# Patient Record
Sex: Male | Born: 1955 | Race: Black or African American | Hispanic: No | Marital: Married | Smoking: Never smoker
Health system: Southern US, Community
[De-identification: ages and names within clinical notes are randomized; demographics above are authoritative.]

## PROBLEM LIST (undated history)

## (undated) DIAGNOSIS — J449 Chronic obstructive pulmonary disease, unspecified: Secondary | ICD-10-CM

---

## 1997-08-27 ENCOUNTER — Emergency Department (HOSPITAL_COMMUNITY): Admission: EM | Admit: 1997-08-27 | Discharge: 1997-08-27 | Payer: Self-pay | Admitting: Emergency Medicine

## 1997-11-20 ENCOUNTER — Emergency Department (HOSPITAL_COMMUNITY): Admission: EM | Admit: 1997-11-20 | Discharge: 1997-11-20 | Payer: Self-pay | Admitting: Internal Medicine

## 1997-11-20 ENCOUNTER — Encounter: Payer: Self-pay | Admitting: Internal Medicine

## 1999-10-21 ENCOUNTER — Emergency Department (HOSPITAL_COMMUNITY): Admission: EM | Admit: 1999-10-21 | Discharge: 1999-10-21 | Payer: Self-pay

## 2007-10-21 ENCOUNTER — Observation Stay (HOSPITAL_COMMUNITY): Admission: AC | Admit: 2007-10-21 | Discharge: 2007-10-21 | Payer: Self-pay
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
## 2010-10-22 LAB — POCT I-STAT, CHEM 8
BUN: 7
Calcium, Ion: 1.14
Chloride: 107
Glucose, Bld: 111 — ABNORMAL HIGH

## 2010-10-22 LAB — TYPE AND SCREEN
ABO/RH(D): A POS
Antibody Screen: NEGATIVE

## 2010-10-22 LAB — CBC
HCT: 42.7
Hemoglobin: 14.5
MCHC: 34
MCV: 97.9
RDW: 13.2

## 2014-11-13 ENCOUNTER — Emergency Department (HOSPITAL_COMMUNITY): Payer: No Typology Code available for payment source

## 2014-11-13 ENCOUNTER — Encounter (HOSPITAL_COMMUNITY): Payer: Self-pay | Admitting: Cardiology

## 2014-11-13 ENCOUNTER — Emergency Department (HOSPITAL_COMMUNITY)
Admission: EM | Admit: 2014-11-13 | Discharge: 2014-11-13 | Disposition: A | Discharge status: Home / Self Care | Payer: No Typology Code available for payment source | Attending: Emergency Medicine | Admitting: Emergency Medicine

## 2014-11-13 DIAGNOSIS — Y998 Other external cause status: Secondary | ICD-10-CM | POA: Insufficient documentation

## 2014-11-13 DIAGNOSIS — Y9389 Activity, other specified: Secondary | ICD-10-CM | POA: Diagnosis not present

## 2014-11-13 DIAGNOSIS — Z720 Tobacco use: Secondary | ICD-10-CM | POA: Insufficient documentation

## 2014-11-13 DIAGNOSIS — S59902A Unspecified injury of left elbow, initial encounter: Secondary | ICD-10-CM | POA: Diagnosis present

## 2014-11-13 DIAGNOSIS — S41102A Unspecified open wound of left upper arm, initial encounter: Secondary | ICD-10-CM

## 2014-11-13 DIAGNOSIS — S51802A Unspecified open wound of left forearm, initial encounter: Secondary | ICD-10-CM | POA: Diagnosis not present

## 2014-11-13 DIAGNOSIS — Y92410 Unspecified street and highway as the place of occurrence of the external cause: Secondary | ICD-10-CM | POA: Insufficient documentation

## 2014-11-13 MED ORDER — CEPHALEXIN 500 MG PO CAPS: 500.0000 mg | ORAL_CAPSULE | Freq: Four times a day (QID) | ORAL | Status: DC

## 2014-11-13 MED ORDER — TRAMADOL HCL 50 MG PO TABS: 50.0000 mg | ORAL_TABLET | Freq: Four times a day (QID) | ORAL | Status: DC | PRN

## 2014-11-13 NOTE — Discharge Instructions (Signed)
Take keflex as directed until gone. Take tramadol as needed for pain. Refer to attached documents for more information. Return to the ED with worsening or concerning symptoms.
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
## 2014-11-13 NOTE — ED Notes (Signed)
Declined W/C at D/C and was escorted to lobby by RN. 
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
## 2016-06-19 ENCOUNTER — Emergency Department (HOSPITAL_COMMUNITY)
Admission: EM | Admit: 2016-06-19 | Discharge: 2016-06-20 | Discharge status: Against Medical Advice | Payer: Self-pay | Attending: Emergency Medicine | Admitting: Emergency Medicine

## 2016-06-19 DIAGNOSIS — S0101XA Laceration without foreign body of scalp, initial encounter: Secondary | ICD-10-CM | POA: Insufficient documentation

## 2016-06-19 DIAGNOSIS — Y929 Unspecified place or not applicable: Secondary | ICD-10-CM | POA: Insufficient documentation

## 2016-06-19 DIAGNOSIS — F17200 Nicotine dependence, unspecified, uncomplicated: Secondary | ICD-10-CM | POA: Insufficient documentation

## 2016-06-19 DIAGNOSIS — Y999 Unspecified external cause status: Secondary | ICD-10-CM | POA: Insufficient documentation

## 2016-06-19 DIAGNOSIS — S0990XA Unspecified injury of head, initial encounter: Secondary | ICD-10-CM

## 2016-06-19 DIAGNOSIS — Z79899 Other long term (current) drug therapy: Secondary | ICD-10-CM | POA: Insufficient documentation

## 2016-06-19 DIAGNOSIS — Y939 Activity, unspecified: Secondary | ICD-10-CM | POA: Insufficient documentation

## 2016-06-19 MED ORDER — LIDOCAINE-EPINEPHRINE (PF) 2 %-1:200000 IJ SOLN: 20.0000 mL | Freq: Once | INTRAMUSCULAR | Status: DC
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
## 2016-06-20 NOTE — ED Notes (Signed)
Patient came out of the room stating he needs to leave.  Attending ED Physician made aware.  Physician on the way to talk with the patient.  Patient not in any distress at this time.  Airway protected and walking with steady gait.

## 2016-06-20 NOTE — ED Notes (Signed)
Per ED physician patient is to be discharged AMA.  IV taken out and head would washed.  Patient refused to have wound closed with staples.  Wound wrapped with gauze and no active bleeding noted.  Patient ambulated with steady gait to the waiting room.  Patient refused to sign AMA form.

## 2016-06-20 NOTE — ED Notes (Signed)
Patient does not want to have the scans done and wants to sign out AMA.  MD made aware.

## 2017-05-23 ENCOUNTER — Emergency Department (HOSPITAL_COMMUNITY): Payer: Self-pay

## 2017-05-23 ENCOUNTER — Other Ambulatory Visit: Payer: Self-pay

## 2017-05-23 ENCOUNTER — Inpatient Hospital Stay (HOSPITAL_COMMUNITY)
Admission: EM | Admit: 2017-05-23 | Discharge: 2017-05-26 | DRG: 199 | Disposition: A | Discharge status: Home / Self Care | Payer: Self-pay | Attending: General Surgery | Admitting: General Surgery

## 2017-05-23 ENCOUNTER — Encounter (HOSPITAL_COMMUNITY): Payer: Self-pay | Admitting: Emergency Medicine

## 2017-05-23 DIAGNOSIS — S0181XA Laceration without foreign body of other part of head, initial encounter: Secondary | ICD-10-CM

## 2017-05-23 DIAGNOSIS — J939 Pneumothorax, unspecified: Secondary | ICD-10-CM | POA: Diagnosis present

## 2017-05-23 DIAGNOSIS — S27331A Laceration of lung, unilateral, initial encounter: Secondary | ICD-10-CM | POA: Diagnosis present

## 2017-05-23 DIAGNOSIS — Y92512 Supermarket, store or market as the place of occurrence of the external cause: Secondary | ICD-10-CM

## 2017-05-23 DIAGNOSIS — S21112A Laceration without foreign body of left front wall of thorax without penetration into thoracic cavity, initial encounter: Secondary | ICD-10-CM | POA: Diagnosis present

## 2017-05-23 DIAGNOSIS — Z938 Other artificial opening status: Secondary | ICD-10-CM

## 2017-05-23 DIAGNOSIS — Z4682 Encounter for fitting and adjustment of non-vascular catheter: Secondary | ICD-10-CM

## 2017-05-23 DIAGNOSIS — S272XXA Traumatic hemopneumothorax, initial encounter: Principal | ICD-10-CM | POA: Diagnosis present

## 2017-05-23 DIAGNOSIS — S01511A Laceration without foreign body of lip, initial encounter: Secondary | ICD-10-CM | POA: Diagnosis present

## 2017-05-23 DIAGNOSIS — Z87891 Personal history of nicotine dependence: Secondary | ICD-10-CM

## 2017-05-23 DIAGNOSIS — S01411A Laceration without foreign body of right cheek and temporomandibular area, initial encounter: Secondary | ICD-10-CM | POA: Diagnosis present

## 2017-05-23 LAB — TYPE AND SCREEN
ABO/RH(D): A POS
Antibody Screen: NEGATIVE
UNIT DIVISION: 0
Unit division: 0

## 2017-05-23 LAB — BPAM RBC
Blood Product Expiration Date: 201905252359
Blood Product Expiration Date: 201905292359
ISSUE DATE / TIME: 201905042001
ISSUE DATE / TIME: 201905042001
UNIT TYPE AND RH: 9500
Unit Type and Rh: 9500

## 2017-05-23 LAB — CBC
HEMATOCRIT: 37.8 % — AB (ref 39.0–52.0)
Hemoglobin: 12.5 g/dL — ABNORMAL LOW (ref 13.0–17.0)
MCH: 31.7 pg (ref 26.0–34.0)
MCHC: 33.1 g/dL (ref 30.0–36.0)
MCV: 95.9 fL (ref 78.0–100.0)
Platelets: 250 10*3/uL (ref 150–400)
RBC: 3.94 MIL/uL — ABNORMAL LOW (ref 4.22–5.81)
RDW: 13 % (ref 11.5–15.5)
WBC: 8.2 10*3/uL (ref 4.0–10.5)

## 2017-05-23 LAB — BPAM FFP
BLOOD PRODUCT EXPIRATION DATE: 201905252359
Blood Product Expiration Date: 201905122359
ISSUE DATE / TIME: 201905042002
ISSUE DATE / TIME: 201905042002
Unit Type and Rh: 600
Unit Type and Rh: 6200

## 2017-05-23 LAB — PREPARE FRESH FROZEN PLASMA
UNIT DIVISION: 0
Unit division: 0

## 2017-05-23 LAB — COMPREHENSIVE METABOLIC PANEL
ALT: 14 U/L — ABNORMAL LOW (ref 17–63)
AST: 20 U/L (ref 15–41)
Albumin: 3.9 g/dL (ref 3.5–5.0)
Alkaline Phosphatase: 50 U/L (ref 38–126)
Anion gap: 11 (ref 5–15)
BILIRUBIN TOTAL: 0.5 mg/dL (ref 0.3–1.2)
BUN: 7 mg/dL (ref 6–20)
CHLORIDE: 108 mmol/L (ref 101–111)
CO2: 23 mmol/L (ref 22–32)
Calcium: 8.6 mg/dL — ABNORMAL LOW (ref 8.9–10.3)
Creatinine, Ser: 0.82 mg/dL (ref 0.61–1.24)
GFR calc Af Amer: >60 mL/min (ref >60)
GFR calc non Af Amer: >60 mL/min (ref >60)
Glucose, Bld: 117 mg/dL — ABNORMAL HIGH (ref 65–99)
POTASSIUM: 3.9 mmol/L (ref 3.5–5.1)
Sodium: 142 mmol/L (ref 135–145)
TOTAL PROTEIN: 6.6 g/dL (ref 6.5–8.1)

## 2017-05-23 LAB — I-STAT CHEM 8, ED
BUN: 7 mg/dL (ref 6–20)
Calcium, Ion: 1.15 mmol/L (ref 1.15–1.40)
Chloride: 107 mmol/L (ref 101–111)
Creatinine, Ser: 0.9 mg/dL (ref 0.61–1.24)
Glucose, Bld: 113 mg/dL — ABNORMAL HIGH (ref 65–99)
HEMATOCRIT: 39 % (ref 39.0–52.0)
Hemoglobin: 13.3 g/dL (ref 13.0–17.0)
Potassium: 3.8 mmol/L (ref 3.5–5.1)
SODIUM: 142 mmol/L (ref 135–145)
TCO2: 23 mmol/L (ref 22–32)

## 2017-05-23 LAB — PROTIME-INR
INR: 1.01
Prothrombin Time: 13.2 seconds (ref 11.4–15.2)

## 2017-05-23 LAB — ETHANOL: Alcohol, Ethyl (B): 168 mg/dL — ABNORMAL HIGH (ref <10)

## 2017-05-23 LAB — I-STAT CG4 LACTIC ACID, ED: Lactic Acid, Venous: 1.6 mmol/L (ref 0.5–1.9)

## 2017-05-23 LAB — ABO/RH: ABO/RH(D): A POS

## 2017-05-23 MED ORDER — IOHEXOL 300 MG/ML  SOLN
100.0000 mL | Freq: Once | INTRAMUSCULAR | Status: AC | PRN
  Administered 2017-05-23: 100 mL via INTRAVENOUS

## 2017-05-23 MED ORDER — IBUPROFEN 600 MG PO TABS: 600.0000 mg | ORAL_TABLET | Freq: Four times a day (QID) | ORAL | Status: DC | PRN

## 2017-05-23 MED ORDER — OXYCODONE HCL 5 MG PO TABS
5.0000 mg | ORAL_TABLET | ORAL | Status: DC | PRN
  Administered 2017-05-23 – 2017-05-25 (×2): 5 mg via ORAL
  Filled 2017-05-23 (×2): qty 1

## 2017-05-23 MED ORDER — TETANUS-DIPHTH-ACELL PERTUSSIS 5-2.5-18.5 LF-MCG/0.5 IM SUSP
0.5000 mL | Freq: Once | INTRAMUSCULAR | Status: AC
  Administered 2017-05-23: 0.5 mL via INTRAMUSCULAR

## 2017-05-23 MED ORDER — POTASSIUM CHLORIDE IN NACL 20-0.9 MEQ/L-% IV SOLN
INTRAVENOUS | Status: DC
  Administered 2017-05-23: via INTRAVENOUS
  Filled 2017-05-23: qty 1000

## 2017-05-23 MED ORDER — SODIUM CHLORIDE 0.9 % IV SOLN
INTRAVENOUS | Status: AC | PRN
  Administered 2017-05-23: 1000 mL via INTRAVENOUS

## 2017-05-23 MED ORDER — DOCUSATE SODIUM 100 MG PO CAPS
100.0000 mg | ORAL_CAPSULE | Freq: Two times a day (BID) | ORAL | Status: DC
  Administered 2017-05-24 – 2017-05-25 (×4): 100 mg via ORAL
  Filled 2017-05-23 (×6): qty 1

## 2017-05-23 MED ORDER — ONDANSETRON 4 MG PO TBDP
4.0000 mg | ORAL_TABLET | Freq: Four times a day (QID) | ORAL | Status: DC | PRN
  Filled 2017-05-23: qty 1

## 2017-05-23 MED ORDER — ENOXAPARIN SODIUM 40 MG/0.4ML ~~LOC~~ SOLN
40.0000 mg | SUBCUTANEOUS | Status: DC
  Administered 2017-05-24 – 2017-05-25 (×2): 40 mg via SUBCUTANEOUS
  Filled 2017-05-23 (×3): qty 0.4

## 2017-05-23 MED ORDER — ACETAMINOPHEN 325 MG PO TABS
650.0000 mg | ORAL_TABLET | Freq: Four times a day (QID) | ORAL | Status: DC
  Administered 2017-05-24 – 2017-05-26 (×10): 650 mg via ORAL
  Filled 2017-05-23 (×10): qty 2

## 2017-05-23 MED ORDER — OXYCODONE HCL 5 MG PO TABS
10.0000 mg | ORAL_TABLET | ORAL | Status: DC | PRN
  Administered 2017-05-24 – 2017-05-26 (×3): 10 mg via ORAL
  Filled 2017-05-23 (×3): qty 2

## 2017-05-23 MED ORDER — FENTANYL CITRATE (PF) 100 MCG/2ML IJ SOLN
INTRAMUSCULAR | Status: AC
  Filled 2017-05-23: qty 2

## 2017-05-23 MED ORDER — PANTOPRAZOLE SODIUM 40 MG PO TBEC
40.0000 mg | DELAYED_RELEASE_TABLET | Freq: Every day | ORAL | Status: DC
  Administered 2017-05-24 – 2017-05-26 (×3): 40 mg via ORAL
  Filled 2017-05-23 (×3): qty 1

## 2017-05-23 MED ORDER — FENTANYL CITRATE (PF) 100 MCG/2ML IJ SOLN
INTRAMUSCULAR | Status: AC | PRN
  Administered 2017-05-23: 75 ug via INTRAVENOUS

## 2017-05-23 MED ORDER — ONDANSETRON HCL 4 MG/2ML IJ SOLN: 4.0000 mg | Freq: Four times a day (QID) | INTRAMUSCULAR | Status: DC | PRN

## 2017-05-23 MED ORDER — MORPHINE SULFATE (PF) 4 MG/ML IV SOLN
1.0000 mg | INTRAVENOUS | Status: DC | PRN
  Administered 2017-05-23 – 2017-05-24 (×4): 1 mg via INTRAVENOUS
  Filled 2017-05-23 (×4): qty 1

## 2017-05-23 NOTE — Progress Notes (Addendum)
Chest Tube Insertion Procedure Note  Indications:  Clinically significant Pneumothorax  Pre-operative Diagnosis: Pneumothorax - left   Post-operative Diagnosis: Pneumothorax left  Procedure Details  Informed consent was obtained for the procedure, including sedation.  Risks of lung perforation, hemorrhage, arrhythmia, and adverse drug reaction were discussed. Time out performed.   After sterile skin prep, using standard technique, a 14 French pigtail chest tube was placed in the left lateral 4th rib space using Seldinger technique. Local was used to numb skin, soft tissue and rib. Pt also recd 75 mcg of IV fentanyl.  Vitals monitored throughout.   Findings: Some air rush; no air leak  Estimated Blood Loss:  Minimal         Specimens:  None              Complications:  None; patient tolerated the procedure well.         Disposition: ed         Condition: stable  Attending Attestation: I performed the procedure.  Mary Sella. Andrey Campanile, MD, FACS General, Bariatric, & Minimally Invasive Surgery [facility], [place]
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
## 2017-05-23 NOTE — ED Notes (Signed)
To CT with RN, EMT and MD.

## 2017-05-23 NOTE — ED Notes (Signed)
Patient placed on 2L [place] at this time.  
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
## 2017-05-23 NOTE — ED Notes (Signed)
Patient verbally refusing placement of chest tube.  Dr Andrey Campanile at bedside explaining consequences of no chest tube.  Patient states that he will be okay and does not want the chest tube.  NRB taken off patient at this time, to see if patient maintains oxygen saturation.

## 2017-05-23 NOTE — ED Notes (Signed)
Returned from CT.  [employer] in with patient at this time.

## 2017-05-24 ENCOUNTER — Inpatient Hospital Stay (HOSPITAL_COMMUNITY): Payer: Self-pay

## 2017-05-24 DIAGNOSIS — S01511A Laceration without foreign body of lip, initial encounter: Secondary | ICD-10-CM

## 2017-05-24 DIAGNOSIS — S21112A Laceration without foreign body of left front wall of thorax without penetration into thoracic cavity, initial encounter: Secondary | ICD-10-CM

## 2017-05-24 LAB — URINALYSIS, ROUTINE W REFLEX MICROSCOPIC
Bilirubin Urine: NEGATIVE
GLUCOSE, UA: NEGATIVE mg/dL
Hgb urine dipstick: NEGATIVE
Ketones, ur: 5 mg/dL — AB
Nitrite: NEGATIVE
PH: 5 (ref 5.0–8.0)
PROTEIN: NEGATIVE mg/dL
Specific Gravity, Urine: 1.028 (ref 1.005–1.030)
WBC, UA: >50 WBC/hpf — ABNORMAL HIGH (ref 0–5)

## 2017-05-24 LAB — BASIC METABOLIC PANEL
ANION GAP: 9 (ref 5–15)
BUN: 6 mg/dL (ref 6–20)
CO2: 26 mmol/L (ref 22–32)
Calcium: 8.5 mg/dL — ABNORMAL LOW (ref 8.9–10.3)
Chloride: 107 mmol/L (ref 101–111)
Creatinine, Ser: 0.72 mg/dL (ref 0.61–1.24)
GFR calc Af Amer: >60 mL/min (ref >60)
GLUCOSE: 95 mg/dL (ref 65–99)
POTASSIUM: 4.4 mmol/L (ref 3.5–5.1)
Sodium: 142 mmol/L (ref 135–145)

## 2017-05-24 LAB — CDS SEROLOGY

## 2017-05-24 LAB — CBC
HEMATOCRIT: 35.9 % — AB (ref 39.0–52.0)
HEMOGLOBIN: 11.7 g/dL — AB (ref 13.0–17.0)
MCH: 31.4 pg (ref 26.0–34.0)
MCHC: 32.6 g/dL (ref 30.0–36.0)
MCV: 96.2 fL (ref 78.0–100.0)
Platelets: 251 10*3/uL (ref 150–400)
RBC: 3.73 MIL/uL — AB (ref 4.22–5.81)
RDW: 13.2 % (ref 11.5–15.5)
WBC: 14.9 10*3/uL — AB (ref 4.0–10.5)

## 2017-05-24 LAB — HIV ANTIBODY (ROUTINE TESTING W REFLEX): HIV Screen 4th Generation wRfx: NONREACTIVE

## 2017-05-24 MED ORDER — SODIUM CHLORIDE 0.9% FLUSH: 3.0000 mL | INTRAVENOUS | Status: DC | PRN

## 2017-05-24 MED ORDER — SODIUM CHLORIDE 0.9 % IV SOLN: 250.0000 mL | INTRAVENOUS | Status: DC | PRN

## 2017-05-24 MED ORDER — PHENOL 1.4 % MT LIQD: 1.0000 | OROMUCOSAL | Status: DC | PRN

## 2017-05-24 MED ORDER — DEXTROSE 5 % IV SOLN: 1000.0000 mg | Freq: Four times a day (QID) | INTRAVENOUS | Status: DC | PRN

## 2017-05-24 MED ORDER — MAGIC MOUTHWASH
15.0000 mL | Freq: Four times a day (QID) | ORAL | Status: DC | PRN
  Filled 2017-05-24: qty 15

## 2017-05-24 MED ORDER — HYDROCORTISONE 2.5 % RE CREA
1.0000 "application " | TOPICAL_CREAM | Freq: Four times a day (QID) | RECTAL | Status: DC | PRN
  Filled 2017-05-24: qty 28.35

## 2017-05-24 MED ORDER — HYDROCORTISONE 1 % EX CREA
1.0000 "application " | TOPICAL_CREAM | Freq: Three times a day (TID) | CUTANEOUS | Status: DC | PRN
  Filled 2017-05-24: qty 28

## 2017-05-24 MED ORDER — MORPHINE SULFATE (PF) 2 MG/ML IV SOLN: 2.0000 mg | INTRAVENOUS | Status: DC | PRN

## 2017-05-24 MED ORDER — ENSURE SURGERY PO LIQD
237.0000 mL | Freq: Two times a day (BID) | ORAL | Status: DC
  Administered 2017-05-24: 237 mL via ORAL
  Filled 2017-05-24 (×8): qty 237

## 2017-05-24 MED ORDER — METHOCARBAMOL 500 MG PO TABS: 1000.0000 mg | ORAL_TABLET | Freq: Four times a day (QID) | ORAL | Status: DC | PRN

## 2017-05-24 MED ORDER — METHOCARBAMOL 1000 MG/10ML IJ SOLN
1000.0000 mg | Freq: Four times a day (QID) | INTRAMUSCULAR | Status: DC | PRN
  Filled 2017-05-24: qty 10

## 2017-05-24 MED ORDER — SODIUM CHLORIDE 0.9% FLUSH
3.0000 mL | Freq: Two times a day (BID) | INTRAVENOUS | Status: DC
  Administered 2017-05-24 – 2017-05-25 (×4): 3 mL via INTRAVENOUS

## 2017-05-24 MED ORDER — ALUM & MAG HYDROXIDE-SIMETH 200-200-20 MG/5ML PO SUSP: 30.0000 mL | Freq: Four times a day (QID) | ORAL | Status: DC | PRN

## 2017-05-24 MED ORDER — MENTHOL 3 MG MT LOZG
1.0000 | LOZENGE | OROMUCOSAL | Status: DC | PRN
  Filled 2017-05-24: qty 9

## 2017-05-24 MED ORDER — LIP MEDEX EX OINT
1.0000 "application " | TOPICAL_OINTMENT | Freq: Two times a day (BID) | CUTANEOUS | Status: DC
  Filled 2017-05-24: qty 7

## 2017-05-24 MED ORDER — BLISTEX MEDICATED EX OINT
TOPICAL_OINTMENT | Freq: Two times a day (BID) | CUTANEOUS | Status: DC
  Administered 2017-05-24 – 2017-05-25 (×3): 1 via TOPICAL
  Filled 2017-05-24: qty 6.3

## 2017-05-24 MED ORDER — GUAIFENESIN-DM 100-10 MG/5ML PO SYRP: 10.0000 mL | ORAL_SOLUTION | ORAL | Status: DC | PRN
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
## 2017-05-24 NOTE — Progress Notes (Signed)
Patient arrived on 4E-13 from ED. Patient has left pigtail chest tube. No air leaks. VSS. NSR. Patient placed on telemetry. Patient oriented to room, call bell within reach. Will continue to monitor. Victorino December, RN

## 2017-05-24 NOTE — ED Notes (Signed)
Report given to [facility]
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
## 2017-05-25 ENCOUNTER — Inpatient Hospital Stay (HOSPITAL_COMMUNITY): Payer: Self-pay

## 2017-05-25 LAB — BLOOD PRODUCT ORDER (VERBAL) VERIFICATION

## 2017-05-25 NOTE — Progress Notes (Signed)
Patient refused his colace this evening. Says he doesn't need it anymore. LBM 05/23/17. Will continue to monitor. Victorino December, RN
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
## 2017-05-26 ENCOUNTER — Inpatient Hospital Stay (HOSPITAL_COMMUNITY): Payer: Self-pay

## 2017-05-26 MED ORDER — IBUPROFEN 600 MG PO TABS: 600.0000 mg | ORAL_TABLET | Freq: Four times a day (QID) | ORAL | 0 refills | Status: DC | PRN

## 2017-05-26 MED ORDER — ACETAMINOPHEN 325 MG PO TABS: 650.0000 mg | ORAL_TABLET | Freq: Four times a day (QID) | ORAL | Status: DC

## 2017-05-26 MED ORDER — OXYCODONE HCL 5 MG PO TABS: 5.0000 mg | ORAL_TABLET | Freq: Three times a day (TID) | ORAL | 0 refills | Status: DC | PRN
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
## 2017-05-26 NOTE — Progress Notes (Signed)
CT removed per order and per protocol. Pt tolerated fair. Pt educated on need for CXR around lunch time. Will continue to monitor.

## 2017-05-27 ENCOUNTER — Encounter (HOSPITAL_COMMUNITY): Payer: Self-pay | Admitting: Cardiology
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
## 2021-06-23 ENCOUNTER — Encounter (HOSPITAL_COMMUNITY): Payer: Self-pay | Admitting: Emergency Medicine

## 2021-06-23 ENCOUNTER — Emergency Department (HOSPITAL_COMMUNITY): Payer: Medicare (Managed Care)

## 2021-06-23 ENCOUNTER — Emergency Department (HOSPITAL_COMMUNITY)
Admission: EM | Admit: 2021-06-23 | Discharge: 2021-06-23 | Disposition: A | Discharge status: Home / Self Care | Payer: Medicare (Managed Care) | Attending: Emergency Medicine | Admitting: Emergency Medicine

## 2021-06-23 ENCOUNTER — Other Ambulatory Visit: Payer: Self-pay

## 2021-06-23 DIAGNOSIS — M25462 Effusion, left knee: Secondary | ICD-10-CM | POA: Insufficient documentation

## 2021-06-23 DIAGNOSIS — X501XXA Overexertion from prolonged static or awkward postures, initial encounter: Secondary | ICD-10-CM | POA: Diagnosis not present

## 2021-06-23 DIAGNOSIS — M25562 Pain in left knee: Secondary | ICD-10-CM

## 2021-06-23 MED ORDER — IBUPROFEN 600 MG PO TABS: 600.0000 mg | ORAL_TABLET | Freq: Four times a day (QID) | ORAL | 0 refills | Status: DC | PRN

## 2021-06-23 MED ORDER — KETOROLAC TROMETHAMINE 15 MG/ML IJ SOLN
15.0000 mg | Freq: Once | INTRAMUSCULAR | Status: AC
  Administered 2021-06-23: 15 mg via INTRAMUSCULAR
  Filled 2021-06-23: qty 1

## 2021-06-23 NOTE — ED Notes (Signed)
Patient Alert and oriented to baseline. Stable and ambulatory to baseline. Patient verbalized understanding of the discharge instructions.  Patient belongings were taken by the patient.   

## 2021-06-23 NOTE — Progress Notes (Signed)
Orthopedic Tech Progress Note Patient Details:  DEAVEON LASSONDE [birth year] [id number]  Ortho Devices Type of Ortho Device: Crutches, Knee Immobilizer, Ace wrap Ortho Device/Splint Location: LLE Ortho Device/Splint Interventions: Ordered, Application, Adjustment   Post Interventions Patient Tolerated: Well Instructions Provided: Adjustment of device, Care of device, Poper ambulation with device Knee immobilizer and ace wrap applied, instructions on how to use crutches provided. I told the patient if he had anymore questions about the crutches that he could tell his nurse and I would come back to help him.  Vernona Rieger 06/23/2021, 10:59 AM
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
## 2021-06-23 NOTE — ED Triage Notes (Signed)
C/o L knee pain and swelling x 3 days.  States he twisted his knee.

## 2021-06-23 NOTE — ED Provider Notes (Signed)
[facility] EMERGENCY DEPARTMENT Provider Note   CSN: [id number] Arrival date & time: 06/23/21  7342     History  Chief Complaint  Patient presents with   Knee Pain    Clifford Maldonado is a 66 y.o. male.   Knee Pain Associated symptoms: no back pain and no fever    66 year old male presents emergency department with complaints of left knee pain.  Patient states that he was getting out of heavy machinery when he twisted his left knee and looking pain and causing secondary swelling.  Patient works for a [employer].  Patient has continued to work/walk on the knee since the incident causing increasing swelling and pain.  He has tried symptomatic therapy with ice and had minimal symptomatic relief.  Patient denies fever, redness of area, IV drug use, osteoarthritis hx, gout hx, fall, direct trauma to knee, rash.  Denies chills, sweats, chest pain, shortness of breath, abdominal pain, nausea/vomiting/diarrhea, urinary symptoms, change in bowel habits.  History reviewed. No pertinent past medical history.  History reviewed. No pertinent surgical history.  Home Medications Prior to Admission medications   Medication Sig Start Date End Date Taking? Authorizing Provider  ibuprofen (ADVIL) 600 MG tablet Take 1 tablet (600 mg total) by mouth every 6 (six) hours as needed. 06/23/21  Yes Sherian Maroon A, PA  acetaminophen (TYLENOL) 325 MG tablet Take 2 tablets (650 mg total) by mouth every 6 (six) hours. 05/26/17   Adam Phenix, PA-C  cephALEXin (KEFLEX) 500 MG capsule Take 1 capsule (500 mg total) by mouth 4 (four) times daily. 11/13/14   Emilia Beck, PA-C  oxyCODONE (OXY IR/ROXICODONE) 5 MG immediate release tablet Take 1 tablet (5 mg total) by mouth every 8 (eight) hours as needed for moderate pain. 05/26/17   Adam Phenix, PA-C  traMADol (ULTRAM) 50 MG tablet Take 1 tablet (50 mg total) by mouth every 6 (six) hours as needed. 11/13/14    Emilia Beck, PA-C      Allergies    Patient has no known allergies.    Review of Systems   Review of Systems  Constitutional:  Negative for chills and fever.  HENT:  Negative for ear pain and sore throat.   Eyes:  Negative for pain and visual disturbance.  Respiratory:  Negative for cough and shortness of breath.   Cardiovascular:  Negative for chest pain and palpitations.  Gastrointestinal:  Negative for abdominal pain and vomiting.  Genitourinary:  Negative for dysuria and hematuria.  Musculoskeletal:  Positive for arthralgias and joint swelling. Negative for back pain.  Skin:  Negative for color change, pallor, rash and wound.  Neurological:  Negative for seizures and syncope.  All other systems reviewed and are negative.  Physical Exam Updated Vital Signs BP (!) 173/92   Pulse 97   Temp 98.5 F (36.9 C) (Oral)   Resp 17   SpO2 97%  Physical Exam Vitals and nursing note reviewed.  Constitutional:      General: He is not in acute distress.    Appearance: Normal appearance. He is well-developed and normal weight. He is not ill-appearing, toxic-appearing or diaphoretic.  HENT:     Head: Normocephalic and atraumatic.  Eyes:     Conjunctiva/sclera: Conjunctivae normal.  Cardiovascular:     Rate and Rhythm: Normal rate and regular rhythm.     Heart sounds: No murmur heard. Pulmonary:     Effort: Pulmonary effort is normal. No respiratory distress.  Breath sounds: Normal breath sounds.  Abdominal:     General: Abdomen is flat.     Palpations: Abdomen is soft.     Tenderness: There is no abdominal tenderness. There is no guarding.  Musculoskeletal:        General: No swelling.     Cervical back: Neck supple.     Comments: Limited range of motion of left knee in flexion secondary to significant joint effusion.  Pain elicited with active range of motion.  No tenderness to palpation along medial and lateral joint lines, patella, tibia/fibula and distal femur.   Posterior tibial pulses full and intact bilaterally.  No overlying skin changes including ecchymosis, erythema, areas of induration.  Fluctuance noted along medial/lateral aspects of patella along joint line as well as superior lateral and superior medial.  No Baker's cyst noted.  No cellulitic changes to skin around the area.  Area not warmer to touch compared to surrounding skin.  Skin:    General: Skin is warm and dry.     Capillary Refill: Capillary refill takes less than 2 seconds.  Neurological:     General: No focal deficit present.     Mental Status: He is alert and oriented to person, place, and time.  Psychiatric:        Mood and Affect: Mood normal.    ED Results / Procedures / Treatments   Labs (all labs ordered are listed, but only abnormal results are displayed) Labs Reviewed - No data to display  EKG None  Radiology DG Knee Complete 4 Views Left  Result Date: 06/23/2021 CLINICAL DATA:  Left knee pain after twisting injury 3 days ago EXAM: LEFT KNEE - COMPLETE 4+ VIEW COMPARISON:  None Available. FINDINGS: No evidence of acute fracture. No malalignment. Moderate-sized knee joint effusion. No visible fat-fluid level. Joint spaces are preserved. No evidence of arthropathy or other focal bone abnormality. Soft tissues are unremarkable. IMPRESSION: 1. Moderate-sized knee joint effusion. 2. No acute fracture or malalignment. Electronically Signed   By: Duanne GuessNicholas  Plundo D.O.   On: 06/23/2021 09:39    Procedures Procedures    Medications Ordered in ED Medications  ketorolac (TORADOL) 15 MG/ML injection 15 mg (15 mg Intramuscular Given 06/23/21 1036)    ED Course/ Medical Decision Making/ A&P                           Medical Decision Making Amount and/or Complexity of Data Reviewed Radiology: ordered.   This patient presents to the ED for concern of left knee pain and effusion, this involves an extensive number of treatment options, and is a complaint that carries with it  a high risk of complications and morbidity.  The differential diagnosis includes osteoarthritis, septic arthritis, gout, Reiter's, ligamentous injury, fracture, meniscal tear   Co morbidities that complicate the patient evaluation  N/a   Lab Tests:  N/a   Imaging Studies ordered:  I ordered imaging studies including left knee x-ray I independently visualized and interpreted imaging which showed moderate joint effusion of the left knee with no acute bony abnormalities. I agree with the radiologist interpretation   Cardiac Monitoring: / EKG:  The patient was maintained on a cardiac monitor.  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  Patient recommended follow-up with PCP for further blood pressure management.. Otherwise within normal range and stable throughout visit. Laboratory/imaging studies significant for: Joint effusion but no fracture Given mechanism of injury and lack of concerning history, doubt gout, septic arthritis, OA.  Symptomatic therapy with rest, ice, elevation, compression with Ace bandage.  Crutches and knee immobilizer provided for patient to use as needed.  Recommend medical therapy with ibuprofen.  Close follow-up with orthopedics encouraged in 5 to 7 days for further treatment/evaluation of knee. Worrisome signs and symptoms  were discussed with the patient, and the patient acknowledged understanding to return to the ED if noticed. Patient was stable upon discharge.          Final Clinical Impression(s) / ED Diagnoses Final diagnoses:  Effusion of left knee  Acute pain of left knee    Rx / DC Orders ED Discharge Orders          Ordered    ibuprofen (ADVIL) 600 MG tablet  Every 6 hours PRN        06/23/21 1037              Peter Garter, [place] 06/23/21 1120    Wynetta Fines, MD 06/25/21 1055

## 2022-03-17 ENCOUNTER — Other Ambulatory Visit: Payer: Self-pay

## 2022-03-17 ENCOUNTER — Emergency Department (HOSPITAL_COMMUNITY): Payer: Medicare (Managed Care)

## 2022-03-17 ENCOUNTER — Emergency Department (HOSPITAL_COMMUNITY)
Admission: EM | Admit: 2022-03-17 | Discharge: 2022-03-17 | Disposition: A | Discharge status: Home / Self Care | Payer: Medicare (Managed Care) | Attending: Emergency Medicine | Admitting: Emergency Medicine

## 2022-03-17 ENCOUNTER — Encounter (HOSPITAL_COMMUNITY): Payer: Self-pay | Admitting: *Deleted

## 2022-03-17 DIAGNOSIS — R200 Anesthesia of skin: Secondary | ICD-10-CM | POA: Diagnosis not present

## 2022-03-17 DIAGNOSIS — J40 Bronchitis, not specified as acute or chronic: Secondary | ICD-10-CM | POA: Diagnosis not present

## 2022-03-17 DIAGNOSIS — R0602 Shortness of breath: Secondary | ICD-10-CM | POA: Diagnosis present

## 2022-03-17 DIAGNOSIS — R000 Tachycardia, unspecified: Secondary | ICD-10-CM | POA: Diagnosis not present

## 2022-03-17 HISTORY — DX: Chronic obstructive pulmonary disease, unspecified: J44.9

## 2022-03-17 LAB — CBC WITH DIFFERENTIAL/PLATELET
Abs Immature Granulocytes: 0.03 10*3/uL (ref 0.00–0.07)
Basophils Absolute: 0.1 10*3/uL (ref 0.0–0.1)
Basophils Relative: 1 %
Eosinophils Absolute: 0.2 10*3/uL (ref 0.0–0.5)
Eosinophils Relative: 2 %
HCT: 42.1 % (ref 39.0–52.0)
Hemoglobin: 14.5 g/dL (ref 13.0–17.0)
Immature Granulocytes: 0 %
Lymphocytes Relative: 8 %
Lymphs Abs: 0.7 10*3/uL (ref 0.7–4.0)
MCH: 33 pg (ref 26.0–34.0)
MCHC: 34.4 g/dL (ref 30.0–36.0)
MCV: 95.7 fL (ref 80.0–100.0)
Monocytes Absolute: 1.5 10*3/uL — ABNORMAL HIGH (ref 0.1–1.0)
Monocytes Relative: 16 %
Neutro Abs: 6.6 10*3/uL (ref 1.7–7.7)
Neutrophils Relative %: 73 %
Platelets: 253 10*3/uL (ref 150–400)
RBC: 4.4 MIL/uL (ref 4.22–5.81)
RDW: 13.5 % (ref 11.5–15.5)
WBC: 9 10*3/uL (ref 4.0–10.5)
nRBC: 0 % (ref 0.0–0.2)

## 2022-03-17 LAB — COMPREHENSIVE METABOLIC PANEL
ALT: 99 U/L — ABNORMAL HIGH (ref 0–44)
AST: 114 U/L — ABNORMAL HIGH (ref 15–41)
Albumin: 3.9 g/dL (ref 3.5–5.0)
Alkaline Phosphatase: 71 U/L (ref 38–126)
Anion gap: 14 (ref 5–15)
BUN: 5 mg/dL — ABNORMAL LOW (ref 8–23)
CO2: 25 mmol/L (ref 22–32)
Calcium: 8.8 mg/dL — ABNORMAL LOW (ref 8.9–10.3)
Chloride: 97 mmol/L — ABNORMAL LOW (ref 98–111)
Creatinine, Ser: 0.8 mg/dL (ref 0.61–1.24)
GFR, Estimated: >60 mL/min (ref >60)
Glucose, Bld: 99 mg/dL (ref 70–99)
Potassium: 4.4 mmol/L (ref 3.5–5.1)
Sodium: 136 mmol/L (ref 135–145)
Total Bilirubin: 0.4 mg/dL (ref 0.3–1.2)
Total Protein: 7.1 g/dL (ref 6.5–8.1)

## 2022-03-17 LAB — D-DIMER, QUANTITATIVE: D-Dimer, Quant: 0.56 ug/mL-FEU — ABNORMAL HIGH (ref 0.00–0.50)

## 2022-03-17 MED ORDER — DOXYCYCLINE HYCLATE 100 MG PO CAPS: 100.0000 mg | ORAL_CAPSULE | Freq: Two times a day (BID) | ORAL | 0 refills | Status: DC

## 2022-03-17 MED ORDER — DOXYCYCLINE HYCLATE 100 MG PO TABS
100.0000 mg | ORAL_TABLET | Freq: Once | ORAL | Status: AC
Start: 2022-03-17 — End: 2022-03-17
  Administered 2022-03-17: 100 mg via ORAL
  Filled 2022-03-17: qty 1

## 2022-03-17 MED ORDER — ALBUTEROL SULFATE HFA 108 (90 BASE) MCG/ACT IN AERS
4.0000 | INHALATION_SPRAY | Freq: Once | RESPIRATORY_TRACT | Status: AC
  Administered 2022-03-17: 4 via RESPIRATORY_TRACT
  Filled 2022-03-17: qty 6.7

## 2022-03-17 MED ORDER — METHYLPREDNISOLONE SODIUM SUCC 125 MG IJ SOLR
125.0000 mg | Freq: Once | INTRAMUSCULAR | Status: AC
  Administered 2022-03-17: 125 mg via INTRAVENOUS
  Filled 2022-03-17: qty 2

## 2022-03-17 MED ORDER — IPRATROPIUM-ALBUTEROL 0.5-2.5 (3) MG/3ML IN SOLN
3.0000 mL | Freq: Once | RESPIRATORY_TRACT | Status: AC
  Administered 2022-03-17: 3 mL via RESPIRATORY_TRACT
  Filled 2022-03-17: qty 3

## 2022-03-17 MED ORDER — LACTATED RINGERS IV BOLUS
1000.0000 mL | Freq: Once | INTRAVENOUS | Status: AC
  Administered 2022-03-17: 1000 mL via INTRAVENOUS

## 2022-03-17 MED ORDER — PREDNISONE 20 MG PO TABS: ORAL_TABLET | ORAL | 0 refills | Status: DC

## 2022-03-17 NOTE — ED Notes (Signed)
Discharge instructions discussed with pt. Verbalized understanding. VSS. No questions or concerns regarding discharges

## 2022-03-17 NOTE — ED Provider Notes (Signed)
[facility] Provider Note   CSN: [id number] Arrival date & time: 03/17/22  0308     History {Add pertinent medical, surgical, social history, OB history to HPI:1} Chief Complaint  Patient presents with   Shortness of Breath    Clifford Maldonado is a 67 y.o. male.  67 year old male with a history of chronic bronchitis who presents the ER today secondary to shortness of breath and productive cough.  Patient states going on for couple weeks.  Initially started upper respiratory infection but her breathing has gotten worse in the sputum and has not improved.  Worse with exertion.  No chest pain or back pain.  No fevers.  No lower extremity swelling.  Does have some mild numbness in his right thigh that is new over the last few months but is unclear on its etiology.  Has not seen anyone else for the symptoms yet.   Shortness of Breath      Home Medications Prior to Admission medications   Medication Sig Start Date End Date Taking? Authorizing Provider  acetaminophen (TYLENOL) 325 MG tablet Take 2 tablets (650 mg total) by mouth every 6 (six) hours. 05/26/17   Jill Alexanders, PA-C  cephALEXin (KEFLEX) 500 MG capsule Take 1 capsule (500 mg total) by mouth 4 (four) times daily. 11/13/14   Szekalski, [name], PA-C  ibuprofen (ADVIL) 600 MG tablet Take 1 tablet (600 mg total) by mouth every 6 (six) hours as needed. 06/23/21   Dion Saucier A, PA  oxyCODONE (OXY IR/ROXICODONE) 5 MG immediate release tablet Take 1 tablet (5 mg total) by mouth every 8 (eight) hours as needed for moderate pain. 05/26/17   Jill Alexanders, PA-C  traMADol (ULTRAM) 50 MG tablet Take 1 tablet (50 mg total) by mouth every 6 (six) hours as needed. 11/13/14   Alvina Chou, PA-C      Allergies    Patient has no known allergies.    Review of Systems   Review of Systems  Respiratory:  Positive for shortness of breath.     Physical Exam Updated Vital  Signs BP 136/71   Pulse (!) 108   Temp 98.6 F (37 C) (Oral)   Resp 20   Ht '5\' 4"'$  (1.626 m)   Wt 58.1 kg   SpO2 96%   BMI 21.97 kg/m  Physical Exam Vitals and nursing note reviewed.  Constitutional:      Appearance: He is well-developed.  HENT:     Head: Normocephalic and atraumatic.  Cardiovascular:     Rate and Rhythm: Tachycardia present.  Pulmonary:     Effort: Pulmonary effort is normal. No respiratory distress.     Breath sounds: Decreased breath sounds and wheezing present.  Abdominal:     General: There is no distension.  Musculoskeletal:        General: Normal range of motion.     Cervical back: Normal range of motion.  Neurological:     Mental Status: He is alert.     ED Results / Procedures / Treatments   Labs (all labs ordered are listed, but only abnormal results are displayed) Labs Reviewed  CBC WITH DIFFERENTIAL/PLATELET  COMPREHENSIVE METABOLIC PANEL  D-DIMER, QUANTITATIVE    EKG None  Radiology No results found.  Procedures Procedures  {Document cardiac monitor, telemetry assessment procedure when appropriate:1}  Medications Ordered in ED Medications  lactated ringers bolus 1,000 mL (has no administration in time range)  ipratropium-albuterol (DUONEB) 0.5-2.5 (3) MG/3ML  nebulizer solution 3 mL (has no administration in time range)  methylPREDNISolone sodium succinate (SOLU-MEDROL) 125 mg/2 mL injection 125 mg (has no administration in time range)  doxycycline (VIBRA-TABS) tablet 100 mg (has no administration in time range)    ED Course/ Medical Decision Making/ A&P   {   Click here for ABCD2, HEART and other calculatorsREFRESH Note before signing :1}                          Medical Decision Making Amount and/or Complexity of Data Reviewed Labs: ordered. Radiology: ordered. ECG/medicine tests: ordered.  Risk Prescription drug management.  Pneumonia versus bronchitis versus PE.  Will get x-ray give some breathing treatments and  some fluids and reassess for improvement and disposition. ***  {Document critical care time when appropriate:1} {Document review of labs and clinical decision tools ie heart score, Chads2Vasc2 etc:1}  {Document your independent review of radiology images, and any outside records:1} {Document your discussion with family members, caretakers, and with consultants:1} {Document social determinants of health affecting pt's care:1} {Document your decision making why or why not admission, treatments were needed:1} Final Clinical Impression(s) / ED Diagnoses Final diagnoses:  None    Rx / DC Orders ED Discharge Orders     None

## 2022-03-17 NOTE — ED Triage Notes (Signed)
Patient presents to ed via POV c/o sob onset 1 week ago states he is worse today c/o productive cough with green colored sputum.

## 2022-08-23 ENCOUNTER — Other Ambulatory Visit: Payer: Self-pay

## 2022-08-23 ENCOUNTER — Emergency Department (HOSPITAL_COMMUNITY): Payer: Medicare HMO

## 2022-08-23 ENCOUNTER — Inpatient Hospital Stay (HOSPITAL_COMMUNITY)
Admission: EM | Admit: 2022-08-23 | Discharge: 2022-08-25 | DRG: 189 | Disposition: A | Discharge status: Home / Self Care | Payer: Medicare HMO | Attending: Internal Medicine | Admitting: Internal Medicine

## 2022-08-23 ENCOUNTER — Encounter (HOSPITAL_COMMUNITY): Payer: Self-pay

## 2022-08-23 DIAGNOSIS — R0902 Hypoxemia: Secondary | ICD-10-CM | POA: Diagnosis present

## 2022-08-23 DIAGNOSIS — J9601 Acute respiratory failure with hypoxia: Secondary | ICD-10-CM | POA: Diagnosis present

## 2022-08-23 DIAGNOSIS — Z1152 Encounter for screening for COVID-19: Secondary | ICD-10-CM | POA: Diagnosis not present

## 2022-08-23 DIAGNOSIS — Z716 Tobacco abuse counseling: Secondary | ICD-10-CM | POA: Diagnosis not present

## 2022-08-23 DIAGNOSIS — J189 Pneumonia, unspecified organism: Secondary | ICD-10-CM | POA: Diagnosis present

## 2022-08-23 DIAGNOSIS — T380X5A Adverse effect of glucocorticoids and synthetic analogues, initial encounter: Secondary | ICD-10-CM | POA: Diagnosis not present

## 2022-08-23 DIAGNOSIS — D72829 Elevated white blood cell count, unspecified: Secondary | ICD-10-CM | POA: Diagnosis present

## 2022-08-23 DIAGNOSIS — Z79899 Other long term (current) drug therapy: Secondary | ICD-10-CM | POA: Diagnosis not present

## 2022-08-23 DIAGNOSIS — Z87891 Personal history of nicotine dependence: Secondary | ICD-10-CM | POA: Diagnosis not present

## 2022-08-23 DIAGNOSIS — Z7952 Long term (current) use of systemic steroids: Secondary | ICD-10-CM

## 2022-08-23 DIAGNOSIS — J440 Chronic obstructive pulmonary disease with acute lower respiratory infection: Secondary | ICD-10-CM | POA: Diagnosis present

## 2022-08-23 DIAGNOSIS — J441 Chronic obstructive pulmonary disease with (acute) exacerbation: Secondary | ICD-10-CM | POA: Diagnosis present

## 2022-08-23 LAB — CBC
HCT: 41.6 % (ref 39.0–52.0)
Hemoglobin: 14.3 g/dL (ref 13.0–17.0)
MCH: 32 pg (ref 26.0–34.0)
MCHC: 34.4 g/dL (ref 30.0–36.0)
MCV: 93.1 fL (ref 80.0–100.0)
Platelets: 439 10*3/uL — ABNORMAL HIGH (ref 150–400)
RBC: 4.47 MIL/uL (ref 4.22–5.81)
RDW: 14.6 % (ref 11.5–15.5)
WBC: 12.3 10*3/uL — ABNORMAL HIGH (ref 4.0–10.5)
nRBC: 0.2 % (ref 0.0–0.2)

## 2022-08-23 LAB — BASIC METABOLIC PANEL
Anion gap: 19 — ABNORMAL HIGH (ref 5–15)
BUN: 22 mg/dL (ref 8–23)
CO2: 28 mmol/L (ref 22–32)
Calcium: 9.7 mg/dL (ref 8.9–10.3)
Chloride: 86 mmol/L — ABNORMAL LOW (ref 98–111)
Creatinine, Ser: 0.97 mg/dL (ref 0.61–1.24)
GFR, Estimated: >60 mL/min (ref >60)
Glucose, Bld: 123 mg/dL — ABNORMAL HIGH (ref 70–99)
Potassium: 3.6 mmol/L (ref 3.5–5.1)
Sodium: 133 mmol/L — ABNORMAL LOW (ref 135–145)

## 2022-08-23 LAB — SARS CORONAVIRUS 2 BY RT PCR: SARS Coronavirus 2 by RT PCR: NEGATIVE

## 2022-08-23 LAB — HIV ANTIBODY (ROUTINE TESTING W REFLEX): HIV Screen 4th Generation wRfx: NONREACTIVE

## 2022-08-23 LAB — TROPONIN I (HIGH SENSITIVITY)
Troponin I (High Sensitivity): 25 ng/L — ABNORMAL HIGH (ref <18)
Troponin I (High Sensitivity): 24 ng/L — ABNORMAL HIGH (ref <18)

## 2022-08-23 MED ORDER — GUAIFENESIN ER 600 MG PO TB12
600.0000 mg | ORAL_TABLET | Freq: Two times a day (BID) | ORAL | Status: DC
  Administered 2022-08-23 – 2022-08-25 (×4): 600 mg via ORAL
  Filled 2022-08-23 (×4): qty 1

## 2022-08-23 MED ORDER — SENNOSIDES-DOCUSATE SODIUM 8.6-50 MG PO TABS: 1.0000 | ORAL_TABLET | Freq: Every evening | ORAL | Status: DC | PRN

## 2022-08-23 MED ORDER — METHYLPREDNISOLONE SODIUM SUCC 125 MG IJ SOLR
125.0000 mg | Freq: Once | INTRAMUSCULAR | Status: AC
  Administered 2022-08-23: 125 mg via INTRAVENOUS
  Filled 2022-08-23: qty 2

## 2022-08-23 MED ORDER — ACETAMINOPHEN 325 MG PO TABS: 650.0000 mg | ORAL_TABLET | Freq: Four times a day (QID) | ORAL | Status: DC | PRN

## 2022-08-23 MED ORDER — ONDANSETRON HCL 4 MG PO TABS: 4.0000 mg | ORAL_TABLET | Freq: Four times a day (QID) | ORAL | Status: DC | PRN

## 2022-08-23 MED ORDER — ARFORMOTEROL TARTRATE 15 MCG/2ML IN NEBU
15.0000 ug | INHALATION_SOLUTION | Freq: Two times a day (BID) | RESPIRATORY_TRACT | Status: DC
  Administered 2022-08-23 – 2022-08-25 (×4): 15 ug via RESPIRATORY_TRACT
  Filled 2022-08-23 (×4): qty 2

## 2022-08-23 MED ORDER — METHYLPREDNISOLONE SODIUM SUCC 125 MG IJ SOLR
80.0000 mg | INTRAMUSCULAR | Status: DC
  Administered 2022-08-24: 80 mg via INTRAVENOUS
  Filled 2022-08-23: qty 2

## 2022-08-23 MED ORDER — ACETAMINOPHEN 650 MG RE SUPP: 650.0000 mg | Freq: Four times a day (QID) | RECTAL | Status: DC | PRN

## 2022-08-23 MED ORDER — ENOXAPARIN SODIUM 40 MG/0.4ML IJ SOSY
40.0000 mg | PREFILLED_SYRINGE | INTRAMUSCULAR | Status: DC
  Filled 2022-08-23 (×2): qty 0.4

## 2022-08-23 MED ORDER — ONDANSETRON HCL 4 MG/2ML IJ SOLN: 4.0000 mg | Freq: Four times a day (QID) | INTRAMUSCULAR | Status: DC | PRN

## 2022-08-23 MED ORDER — IPRATROPIUM-ALBUTEROL 0.5-2.5 (3) MG/3ML IN SOLN: 3.0000 mL | Freq: Four times a day (QID) | RESPIRATORY_TRACT | Status: DC | PRN

## 2022-08-23 MED ORDER — SODIUM CHLORIDE 0.9 % IV SOLN
2.0000 g | INTRAVENOUS | Status: DC
  Administered 2022-08-23 – 2022-08-24 (×2): 2 g via INTRAVENOUS
  Filled 2022-08-23 (×2): qty 20

## 2022-08-23 MED ORDER — IPRATROPIUM-ALBUTEROL 0.5-2.5 (3) MG/3ML IN SOLN
6.0000 mL | Freq: Once | RESPIRATORY_TRACT | Status: AC
  Administered 2022-08-23: 6 mL via RESPIRATORY_TRACT
  Filled 2022-08-23: qty 3

## 2022-08-23 MED ORDER — BUDESONIDE 0.25 MG/2ML IN SUSP
0.2500 mg | Freq: Two times a day (BID) | RESPIRATORY_TRACT | Status: DC
  Administered 2022-08-23 – 2022-08-25 (×4): 0.25 mg via RESPIRATORY_TRACT
  Filled 2022-08-23 (×4): qty 2

## 2022-08-23 MED ORDER — SODIUM CHLORIDE 0.9 % IV SOLN
500.0000 mg | INTRAVENOUS | Status: DC
  Administered 2022-08-23 – 2022-08-24 (×2): 500 mg via INTRAVENOUS
  Filled 2022-08-23 (×2): qty 5

## 2022-08-23 NOTE — ED Notes (Signed)
Wife Jeanann Lewandowsky [phone] would like an update asap

## 2022-08-23 NOTE — ED Notes (Signed)
Pt was unable to tolerate weaning off 2l [place], SpO2 87% ra, Placed back on 3l [place]

## 2022-08-23 NOTE — Hospital Course (Signed)
Clifford Maldonado is a 67 y.o. male with medical history significant for COPD with chronic bronchitis who is admitted with acute hypoxic respiratory failure due to multifocal pneumonia and COPD exacerbation.

## 2022-08-23 NOTE — H&P (Signed)
History and Physical    Clifford Maldonado ZOX:[id number] DOB: [birth year] DOA: 08/23/2022  PCP: Pcp, No  Patient coming from: Home  I have personally briefly reviewed patient's old medical records in [facility]  Chief Complaint: Shortness of breath  HPI: Clifford Maldonado is a 67 y.o. male with medical history significant for COPD with chronic bronchitis who presented to the ED for evaluation of shortness of breath.  Patient states for the last 3 days he has been having cough productive of green sputum.  He says he was riding in a car with a coworker a couple days ago who was smoking marijuana during the entire car ride.  He says this aggravated his bronchitis and he has been having shortness of breath since.  He reports subjective fevers and has been taking ibuprofen as needed.  He denies any chest pain, nausea, vomiting, dysuria, diarrhea.  He denies any tobacco use or illicit drug use.  He reports drinking a beer once in a while but no recent binge drinking.  ED Course  Labs/Imaging on admission: I have personally reviewed following labs and imaging studies.  Initial vitals showed 135/65, pulse 118, RR 22, temp 97.8 F, SpO2 85% on room air.  Patient placed on 3 L O2 via [place] with SpO2 improved to 93-95%.  Labs show WBC 12.3, hemoglobin 14.3, platelets 439,000, sodium 133, potassium 3.6, bicarb 28, BUN 22, creatinine 0.97, serum glucose 123, troponin 25.  SARS-CoV-2 PCR in process.  Portable chest x-ray shows extensive new patchy infiltrates in both lower lung fields, more so on the left side.  Possible small left pleural effusion.  Patient was given IV ceftriaxone and azithromycin, DuoNeb, IV Solu-Medrol 125 mg.  Patient desaturated to 87% on room air when trialed off of supplemental O2.  The hospitalist service was consulted to admit for further evaluation and management.  Review of Systems: All systems reviewed and are negative except as documented in history of present illness  above.   Past Medical History:  Diagnosis Date   COPD (chronic obstructive pulmonary disease) (HCC)     History reviewed. No pertinent surgical history.  Social History:  reports that he has never smoked. He has never used smokeless tobacco. He reports current alcohol use. He reports that he does not use drugs.  No Known Allergies  History reviewed. No pertinent family history.   Prior to Admission medications   Medication Sig Start Date End Date Taking? Authorizing Provider  acetaminophen (TYLENOL) 325 MG tablet Take 2 tablets (650 mg total) by mouth every 6 (six) hours. 05/26/17   Adam Phenix, PA-C  cephALEXin (KEFLEX) 500 MG capsule Take 1 capsule (500 mg total) by mouth 4 (four) times daily. 11/13/14   Emilia Beck, PA-C  doxycycline (VIBRAMYCIN) 100 MG capsule Take 1 capsule (100 mg total) by mouth 2 (two) times daily. One po bid x 7 days 03/17/22   Mesner, Barbara Cower, MD  ibuprofen (ADVIL) 600 MG tablet Take 1 tablet (600 mg total) by mouth every 6 (six) hours as needed. 06/23/21   Sherian Maroon A, PA  oxyCODONE (OXY IR/ROXICODONE) 5 MG immediate release tablet Take 1 tablet (5 mg total) by mouth every 8 (eight) hours as needed for moderate pain. 05/26/17   Adam Phenix, PA-C  predniSONE (DELTASONE) 20 MG tablet 2 tabs po daily x 4 days 03/17/22   Mesner, Barbara Cower, MD  traMADol (ULTRAM) 50 MG tablet Take 1 tablet (50 mg total) by mouth every 6 (six) hours as  needed. 11/13/14   Emilia Beck, PA-C    Physical Exam: Vitals:   08/23/22 2149 08/23/22 2200 08/23/22 2215 08/23/22 2230  BP:  117/71 139/70 109/74  Pulse: (!) 106 (!) 103 89 84  Resp: 18 20 20    Temp:      TempSrc:      SpO2: 94% 99% 95% 96%  Weight:       Constitutional: Resting in bed, NAD, calm, comfortable Eyes: EOMI, lids and conjunctivae normal ENMT: Mucous membranes are moist. Posterior pharynx clear of any exudate or lesions.Normal dentition.  Neck: normal, supple, no masses. Respiratory:  Coarse expiratory wheezing lower lung fields. Normal respiratory effort. No accessory muscle use.  Cardiovascular: Tachycardic, no murmurs / rubs / gallops. No extremity edema. 2+ pedal pulses. Abdomen: no tenderness, no masses palpated.  Musculoskeletal: no clubbing / cyanosis. No joint deformity upper and lower extremities. Good ROM, no contractures. Normal muscle tone.  Skin: no rashes, lesions, ulcers. No induration Neurologic: Sensation intact. Strength 5/5 in all 4.  Psychiatric: Normal judgment and insight. Alert and oriented x 3. Normal mood.   EKG: Personally reviewed. Sinus tachycardia, rate 112, motion artifact.  Similar to prior.  Assessment/Plan Principal Problem:   Acute respiratory failure with hypoxia (HCC) Active Problems:   Multifocal pneumonia   COPD with acute exacerbation (HCC)   Clifford Maldonado is a 67 y.o. male with medical history significant for COPD with chronic bronchitis who is admitted with acute hypoxic respiratory failure due to multifocal pneumonia and COPD exacerbation.  Assessment and Plan: Acute respiratory failure with hypoxia due to multifocal pneumonia: SpO2 as low as 85% on RA.  Stable on 3 L via [place].  CXR concerning for multifocal pneumonia.  Exacerbation of COPD/bronchitis also contributing.  COVID is negative. -Continue IV ceftriaxone and azithromycin -Mucolytic's, wean supplemental O2 as able  Acute exacerbation of COPD with chronic bronchitis: Symptoms improving but wheezing still present at time of admission. -Brovana/Pulmicort BID, DuoNebs as needed -IV Solu-Medrol 40 mg twice daily -Azithromycin   DVT prophylaxis: enoxaparin (LOVENOX) injection 40 mg Start: 08/23/22 2315 Code Status: Full code, confirmed with patient on admission Family Communication: Discussed with patient, he will discuss with family Disposition Plan: From home and likely discharge to home pending clinical progress Consults called: None Severity of Illness: The  appropriate patient status for this patient is OBSERVATION. Observation status is judged to be reasonable and necessary in order to provide the required intensity of service to ensure the patient's safety. The patient's presenting symptoms, physical exam findings, and initial radiographic and laboratory data in the context of their medical condition is felt to place them at decreased risk for further clinical deterioration. Furthermore, it is anticipated that the patient will be medically stable for discharge from the hospital within 2 midnights of admission.   Darreld Mclean MD [facility]  If 7PM-7AM, please contact night-coverage [email or web address]  08/23/2022, 11:09 PM

## 2022-08-23 NOTE — ED Notes (Signed)
Attempted to take pt off the O2, was unable to tolerate coming off [place], pt dropped down to 09% SpO2, placed back on 2l SpO2 and will ateempted to wean him off in 1hr. Primary ER provider has been made aware
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
## 2022-08-23 NOTE — ED Triage Notes (Signed)
Pt arrives with c/o SOb that started about 3 days ago. Pt endorses productive cough, CP, and wheezing. Pt was 855 on RA on arrival. Pt placed on 2L [place] in triage. Pt has hx of COPD. Pt oxygen sat at 91% after 2L [place].

## 2022-08-24 DIAGNOSIS — J9601 Acute respiratory failure with hypoxia: Secondary | ICD-10-CM | POA: Diagnosis not present

## 2022-08-24 LAB — CBC
HCT: 39.2 % (ref 39.0–52.0)
Hemoglobin: 13.4 g/dL (ref 13.0–17.0)
MCH: 31.7 pg (ref 26.0–34.0)
MCHC: 34.2 g/dL (ref 30.0–36.0)
MCV: 92.7 fL (ref 80.0–100.0)
Platelets: 457 10*3/uL — ABNORMAL HIGH (ref 150–400)
RBC: 4.23 MIL/uL (ref 4.22–5.81)
RDW: 14.8 % (ref 11.5–15.5)
WBC: 9.8 10*3/uL (ref 4.0–10.5)
nRBC: 0.2 % (ref 0.0–0.2)

## 2022-08-24 LAB — PROCALCITONIN: Procalcitonin: 1.58 ng/mL

## 2022-08-24 LAB — BASIC METABOLIC PANEL WITH GFR
Anion gap: 12 (ref 5–15)
BUN: 23 mg/dL (ref 8–23)
CO2: 31 mmol/L (ref 22–32)
Calcium: 9.1 mg/dL (ref 8.9–10.3)
Chloride: 91 mmol/L — ABNORMAL LOW (ref 98–111)
Creatinine, Ser: 0.97 mg/dL (ref 0.61–1.24)
GFR, Estimated: >60 mL/min (ref >60)
Glucose, Bld: 181 mg/dL — ABNORMAL HIGH (ref 70–99)
Potassium: 3.8 mmol/L (ref 3.5–5.1)
Sodium: 134 mmol/L — ABNORMAL LOW (ref 135–145)

## 2022-08-24 LAB — EXPECTORATED SPUTUM ASSESSMENT W GRAM STAIN, RFLX TO RESP C

## 2022-08-24 MED ORDER — MONTELUKAST SODIUM 10 MG PO TABS
10.0000 mg | ORAL_TABLET | Freq: Every day | ORAL | Status: DC
  Administered 2022-08-24: 10 mg via ORAL
  Filled 2022-08-24: qty 1

## 2022-08-24 MED ORDER — METHYLPREDNISOLONE SODIUM SUCC 40 MG IJ SOLR
40.0000 mg | Freq: Two times a day (BID) | INTRAMUSCULAR | Status: DC
  Administered 2022-08-24 – 2022-08-25 (×3): 40 mg via INTRAVENOUS
  Filled 2022-08-24 (×3): qty 1

## 2022-08-24 NOTE — Plan of Care (Signed)
  Problem: Education: Goal: Individualized Educational Video(s) Outcome: Progressing   Problem: Activity: Goal: Ability to tolerate increased activity will improve Outcome: Progressing   Problem: Respiratory: Goal: Ability to maintain a clear airway will improve Outcome: Progressing Goal: Ability to maintain adequate ventilation will improve Outcome: Progressing
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
## 2022-08-24 NOTE — ED Notes (Signed)
Pt removed nasal cannula to drink coffee and desat to 84%. Pt states no trouble breathing at this time. [place] reapplied and sat up to 95%.

## 2022-08-24 NOTE — Progress Notes (Signed)
PROGRESS NOTE    Clifford Maldonado  UEA:[id number] DOB: [birth year] DOA: 08/23/2022 PCP: Pcp, No  Outpatient Specialists:     Brief Narrative:  Patient is a 67 year old African-American male with past medical history significant for COPD ("chronic bronchitis"), alcoholic and reformed tobacco abuser.  According to patient, he smoked 1 and half packs of cigarettes daily for about 40 years.  Patient quit cigarettes about 10 years ago.  Patient drinks mainly beer.  Last alcohol intake was 3 days ago.  Patient's wife confirmed that patient had tremors a couple of days ago.  No current withdrawal symptoms.  Patient was admitted with acute respiratory failure, multifocal pneumonia, COPD exacerbation and/or possible asthma exacerbation.  Patient continues to have productive cough.  Patient coughs up greenish phlegm's.  Fever has resolved.  On further questioning, patient tells me that he has not been seen by a provider in about 10 years.  Patient has not had colonoscopy.  Patient has not been screened for possible lung cancer.  Order preventative medicine requirements may not have been made.  08/24/2022: Patient seen alongside patient's wife.  No fever or chills.  As documented above, patient continues to have productive cough.  Patient is wheezing.  Patient is on IV antibiotics, neb treatment and antibiotics.  Leukocytosis has resolved.   Assessment & Plan:   Principal Problem:   Acute respiratory failure with hypoxia (HCC) Active Problems:   Multifocal pneumonia   COPD with acute exacerbation (HCC)   Acute respiratory failure with hypoxia: -Likely multifactorial. -Patient may have history of COPD/asthma. -Patient has 60 pack years. -Patient has not followed up with the provider in the last 10 years. -Currently, patient has multifocal pneumonia. -SpO2 as low as 85% on RA.  Patient is requiring supplemental oxygen stable on 3 L via [place].   -CXR concerning for multifocal pneumonia.   -Exacerbation  of COPD/bronchitis/possible undiagnosed asthma also contributing.  COVID is negative. -Continue IV ceftriaxone and azithromycin -Continue nebulizer treatment. -Continue IV Solu-Medrol -Start Singulair 10 Mg p.o. once daily -Peak flow daily -Mucolytic's, wean supplemental O2 as able   Multifocal pneumonia: -Continue antibiotics. -Sputum culture.   Acute exacerbation of COPD with chronic bronchitis: -See above documentation.   -Symptoms improving but wheezing. -Brovana/Pulmicort BID, DuoNebs as needed -IV Solu-Medrol 40 mg twice daily -Azithromycin -Will need follow-up with pulmonary team on discharge.  Reformed tobacco abuser: -Patient has 60 pack years. -Follow-up with pulmonary team on discharge. -Lung cancer screening on discharge -Patient will also need screening for aortic aneurysm and other indicated preventative medicine requirements.    DVT prophylaxis: Subcutaneous Lovenox. Code Status: Full code. Family Communication: Wife was at bedside. Disposition Plan: Home eventually.  Patient is inpatient.   Consultants:  None.  Patient will need to follow-up with pulmonary team on discharge.  Procedures:  None.  Antimicrobials:  IV Rocephin. IV azithromycin.   Subjective: Shortness of breath Productive cough Wheezing  Objective: Vitals:   08/24/22 0530 08/24/22 0600 08/24/22 0643 08/24/22 0700  BP:  118/60  121/81  Pulse: 76 67  87  Resp: 20 20  17   Temp:   97.7 F (36.5 C)   TempSrc:   Oral   SpO2: 99% 98%  98%  Weight:       No intake or output data in the 24 hours ending 08/24/22 1019 Filed Weights   08/23/22 1922  Weight: 56.7 kg    Examination:  General exam: Patient is thin.  Patient is not in any distress.  Appears calm  and comfortable  Respiratory system: Decreased air entry with mainly expiratory wheeze. Cardiovascular system: S1 & S2 heard. Gastrointestinal system: Abdomen is soft and nontender.  Central nervous system: Alert and  oriented.  Patient moves all extremities.   Extremities: No leg edema.  Data Reviewed: I have personally reviewed following labs and imaging studies  CBC: Recent Labs  Lab 08/23/22 1933 08/24/22 0246  WBC 12.3* 9.8  HGB 14.3 13.4  HCT 41.6 39.2  MCV 93.1 92.7  PLT 439* 457*   Basic Metabolic Panel: Recent Labs  Lab 08/23/22 1933 08/24/22 0246  NA 133* 134*  K 3.6 3.8  CL 86* 91*  CO2 28 31  GLUCOSE 123* 181*  BUN 22 23  CREATININE 0.97 0.97  CALCIUM 9.7 9.1   GFR: Estimated Creatinine Clearance: 58.5 mL/min (by C-G formula based on SCr of 0.97 mg/dL). Liver Function Tests: No results for input(s): "AST", "ALT", "ALKPHOS", "BILITOT", "PROT", "ALBUMIN" in the last 168 hours. No results for input(s): "LIPASE", "AMYLASE" in the last 168 hours. No results for input(s): "AMMONIA" in the last 168 hours. Coagulation Profile: No results for input(s): "INR", "PROTIME" in the last 168 hours. Cardiac Enzymes: No results for input(s): "CKTOTAL", "CKMB", "CKMBINDEX", "TROPONINI" in the last 168 hours. BNP (last 3 results) No results for input(s): "PROBNP" in the last 8760 hours. HbA1C: No results for input(s): "HGBA1C" in the last 72 hours. CBG: No results for input(s): "GLUCAP" in the last 168 hours. Lipid Profile: No results for input(s): "CHOL", "HDL", "LDLCALC", "TRIG", "CHOLHDL", "LDLDIRECT" in the last 72 hours. Thyroid Function Tests: No results for input(s): "TSH", "T4TOTAL", "FREET4", "T3FREE", "THYROIDAB" in the last 72 hours. Anemia Panel: No results for input(s): "VITAMINB12", "FOLATE", "FERRITIN", "TIBC", "IRON", "RETICCTPCT" in the last 72 hours. Urine analysis:    Component Value Date/Time   COLORURINE YELLOW 05/24/2017 1546   APPEARANCEUR HAZY (A) 05/24/2017 1546   LABSPEC 1.028 05/24/2017 1546   PHURINE 5.0 05/24/2017 1546   GLUCOSEU NEGATIVE 05/24/2017 1546   HGBUR NEGATIVE 05/24/2017 1546   BILIRUBINUR NEGATIVE 05/24/2017 1546   KETONESUR 5 (A)  05/24/2017 1546   PROTEINUR NEGATIVE 05/24/2017 1546   NITRITE NEGATIVE 05/24/2017 1546   LEUKOCYTESUR LARGE (A) 05/24/2017 1546   Sepsis Labs: @LABRCNTIP (procalcitonin:4,lacticidven:4)  ) Recent Results (from the past 240 hour(s))  SARS Coronavirus 2 by RT PCR (hospital order, performed in [facility] hospital lab) *cepheid single result test* Anterior Nasal Swab     Status: None   Collection Time: 08/23/22 10:00 PM   Specimen: Anterior Nasal Swab  Result Value Ref Range Status   SARS Coronavirus 2 by RT PCR NEGATIVE NEGATIVE Final    Comment: Performed at [facility], [address] [place], [place] [address]         Radiology Studies: DG Chest Portable 1 View  Result Date: 08/23/2022 CLINICAL DATA:  Shortness of breath, productive cough EXAM: PORTABLE CHEST 1 VIEW COMPARISON:  03/17/2022 FINDINGS: Cardiac size is within normal limits. Low position of diaphragms may suggest COPD. There is interval appearance of fairly extensive patchy infiltrates in both lower lung fields suggesting multifocal pneumonia, more so in the left lower lung field. There is minimal blunting of left lateral CP angle. There is no pneumothorax. IMPRESSION: There are fairly extensive new patchy infiltrates in both lower lung fields, more so on the left side suggesting multifocal pneumonia. Possible small left pleural effusion. Electronically Signed   By: Ernie Avena M.D.   On: 08/23/2022 20:08  Scheduled Meds:  arformoterol  15 mcg Nebulization BID   budesonide (PULMICORT) nebulizer solution  0.25 mg Nebulization BID   enoxaparin (LOVENOX) injection  40 mg Subcutaneous Q24H   guaiFENesin  600 mg Oral BID   methylPREDNISolone (SOLU-MEDROL) injection  80 mg Intravenous Q24H   Continuous Infusions:  azithromycin Stopped (08/24/22 0118)   cefTRIAXone (ROCEPHIN)  IV Stopped (08/23/22 2239)     LOS: 1 day    Time spent: 55 minutes.    Berton Mount, MD  [facility] Pager #: [phone] 7PM-7AM contact night coverage as above

## 2022-08-24 NOTE — ED Notes (Signed)
Patient resting at this time, will give meds as soon as patient is awake

## 2022-08-25 ENCOUNTER — Other Ambulatory Visit (HOSPITAL_COMMUNITY): Payer: Self-pay

## 2022-08-25 ENCOUNTER — Inpatient Hospital Stay (HOSPITAL_COMMUNITY): Payer: Medicare HMO

## 2022-08-25 DIAGNOSIS — J9601 Acute respiratory failure with hypoxia: Secondary | ICD-10-CM | POA: Diagnosis not present

## 2022-08-25 DIAGNOSIS — J441 Chronic obstructive pulmonary disease with (acute) exacerbation: Secondary | ICD-10-CM

## 2022-08-25 DIAGNOSIS — J189 Pneumonia, unspecified organism: Secondary | ICD-10-CM

## 2022-08-25 MED ORDER — AMOXICILLIN-POT CLAVULANATE 875-125 MG PO TABS
1.0000 | ORAL_TABLET | Freq: Two times a day (BID) | ORAL | 0 refills | Status: AC
  Filled 2022-08-25: qty 8, 4d supply, fill #0

## 2022-08-25 MED ORDER — UMECLIDINIUM BROMIDE 62.5 MCG/ACT IN AEPB
1.0000 | INHALATION_SPRAY | Freq: Every day | RESPIRATORY_TRACT | 2 refills | Status: AC
  Filled 2022-08-25: qty 30, 30d supply, fill #0

## 2022-08-25 MED ORDER — ALBUTEROL SULFATE HFA 108 (90 BASE) MCG/ACT IN AERS
2.0000 | INHALATION_SPRAY | Freq: Four times a day (QID) | RESPIRATORY_TRACT | 1 refills | Status: AC | PRN
  Filled 2022-08-25: qty 6.7, 30d supply, fill #0

## 2022-08-25 MED ORDER — MOMETASONE FURO-FORMOTEROL FUM 100-5 MCG/ACT IN AERO: 2.0000 | INHALATION_SPRAY | Freq: Two times a day (BID) | RESPIRATORY_TRACT | Status: DC

## 2022-08-25 MED ORDER — PREDNISONE 10 MG PO TABS
ORAL_TABLET | ORAL | 0 refills | Status: AC
  Filled 2022-08-25: qty 10, 4d supply, fill #0

## 2022-08-25 MED ORDER — UMECLIDINIUM BROMIDE 62.5 MCG/ACT IN AEPB
1.0000 | INHALATION_SPRAY | Freq: Every day | RESPIRATORY_TRACT | Status: DC
  Administered 2022-08-25: 1 via RESPIRATORY_TRACT
  Filled 2022-08-25: qty 7

## 2022-08-25 MED ORDER — MOMETASONE FURO-FORMOTEROL FUM 100-5 MCG/ACT IN AERO
2.0000 | INHALATION_SPRAY | Freq: Two times a day (BID) | RESPIRATORY_TRACT | 2 refills | Status: AC
  Filled 2022-08-25: qty 13, 30d supply, fill #0

## 2022-08-25 MED ORDER — ALBUTEROL SULFATE (2.5 MG/3ML) 0.083% IN NEBU: 3.0000 mL | INHALATION_SOLUTION | RESPIRATORY_TRACT | Status: DC | PRN

## 2022-08-25 MED ORDER — AZITHROMYCIN 500 MG PO TABS
500.0000 mg | ORAL_TABLET | Freq: Every day | ORAL | 0 refills | Status: AC
  Filled 2022-08-25: qty 1, 1d supply, fill #0

## 2022-08-25 NOTE — Plan of Care (Signed)
Problem: Education: Goal: Knowledge of disease or condition will improve 08/25/2022 1225 by Juluis Mire, RN Outcome: Adequate for Discharge 08/25/2022 1225 by Juluis Mire, RN Outcome: Adequate for Discharge Goal: Knowledge of the prescribed therapeutic regimen will improve 08/25/2022 1225 by Juluis Mire, RN Outcome: Adequate for Discharge 08/25/2022 1225 by Juluis Mire, RN Outcome: Adequate for Discharge Goal: Individualized Educational Video(s) 08/25/2022 1225 by Juluis Mire, RN Outcome: Adequate for Discharge 08/25/2022 1225 by Juluis Mire, RN Outcome: Adequate for Discharge   Problem: Activity: Goal: Ability to tolerate increased activity will improve 08/25/2022 1225 by Juluis Mire, RN Outcome: Adequate for Discharge 08/25/2022 1225 by Juluis Mire, RN Outcome: Adequate for Discharge Goal: Will verbalize the importance of balancing activity with adequate rest periods 08/25/2022 1225 by Juluis Mire, RN Outcome: Adequate for Discharge 08/25/2022 1225 by Juluis Mire, RN Outcome: Adequate for Discharge   Problem: Respiratory: Goal: Ability to maintain a clear airway will improve 08/25/2022 1225 by Juluis Mire, RN Outcome: Adequate for Discharge 08/25/2022 1225 by Juluis Mire, RN Outcome: Adequate for Discharge Goal: Levels of oxygenation will improve 08/25/2022 1225 by Juluis Mire, RN Outcome: Adequate for Discharge 08/25/2022 1225 by Juluis Mire, RN Outcome: Adequate for Discharge Goal: Ability to maintain adequate ventilation will improve 08/25/2022 1225 by Juluis Mire, RN Outcome: Adequate for Discharge 08/25/2022 1225 by Juluis Mire, RN Outcome: Adequate for Discharge   Problem: Activity: Goal: Ability to tolerate increased activity will improve 08/25/2022 1225 by Juluis Mire, RN Outcome: Adequate for Discharge 08/25/2022 1225 by Juluis Mire, RN Outcome:  Adequate for Discharge   Problem: Clinical Measurements: Goal: Ability to maintain a body temperature in the normal range will improve 08/25/2022 1225 by Juluis Mire, RN Outcome: Adequate for Discharge 08/25/2022 1225 by Juluis Mire, RN Outcome: Adequate for Discharge   Problem: Respiratory: Goal: Ability to maintain adequate ventilation will improve 08/25/2022 1225 by Juluis Mire, RN Outcome: Adequate for Discharge 08/25/2022 1225 by Juluis Mire, RN Outcome: Adequate for Discharge Goal: Ability to maintain a clear airway will improve 08/25/2022 1225 by Juluis Mire, RN Outcome: Adequate for Discharge 08/25/2022 1225 by Juluis Mire, RN Outcome: Adequate for Discharge   Problem: Education: Goal: Knowledge of General Education information will improve Description: Including pain rating scale, medication(s)/side effects and non-pharmacologic comfort measures 08/25/2022 1225 by Juluis Mire, RN Outcome: Adequate for Discharge 08/25/2022 1225 by Juluis Mire, RN Outcome: Adequate for Discharge   Problem: Health Behavior/Discharge Planning: Goal: Ability to manage health-related needs will improve 08/25/2022 1225 by Juluis Mire, RN Outcome: Adequate for Discharge 08/25/2022 1225 by Juluis Mire, RN Outcome: Adequate for Discharge   Problem: Clinical Measurements: Goal: Ability to maintain clinical measurements within normal limits will improve 08/25/2022 1225 by Juluis Mire, RN Outcome: Adequate for Discharge 08/25/2022 1225 by Juluis Mire, RN Outcome: Adequate for Discharge Goal: Will remain free from infection 08/25/2022 1225 by Juluis Mire, RN Outcome: Adequate for Discharge 08/25/2022 1225 by Juluis Mire, RN Outcome: Adequate for Discharge Goal: Diagnostic test results will improve 08/25/2022 1225 by Juluis Mire, RN Outcome: Adequate for Discharge 08/25/2022 1225 by Juluis Mire,  RN Outcome: Adequate for Discharge Goal: Respiratory complications will improve 08/25/2022 1225 by Juluis Mire, RN Outcome: Adequate for Discharge 08/25/2022 1225 by Juluis Mire, RN Outcome: Adequate for Discharge Goal: Cardiovascular complication will be avoided 08/25/2022  1225 by Juluis Mire, RN Outcome: Adequate for Discharge 08/25/2022 1225 by Juluis Mire, RN Outcome: Adequate for Discharge   Problem: Activity: Goal: Risk for activity intolerance will decrease 08/25/2022 1225 by Juluis Mire, RN Outcome: Adequate for Discharge 08/25/2022 1225 by Juluis Mire, RN Outcome: Adequate for Discharge   Problem: Nutrition: Goal: Adequate nutrition will be maintained 08/25/2022 1225 by Juluis Mire, RN Outcome: Adequate for Discharge 08/25/2022 1225 by Juluis Mire, RN Outcome: Adequate for Discharge   Problem: Coping: Goal: Level of anxiety will decrease 08/25/2022 1225 by Juluis Mire, RN Outcome: Adequate for Discharge 08/25/2022 1225 by Juluis Mire, RN Outcome: Adequate for Discharge   Problem: Elimination: Goal: Will not experience complications related to bowel motility 08/25/2022 1225 by Juluis Mire, RN Outcome: Adequate for Discharge 08/25/2022 1225 by Juluis Mire, RN Outcome: Adequate for Discharge Goal: Will not experience complications related to urinary retention 08/25/2022 1225 by Juluis Mire, RN Outcome: Adequate for Discharge 08/25/2022 1225 by Juluis Mire, RN Outcome: Adequate for Discharge   Problem: Pain Managment: Goal: General experience of comfort will improve 08/25/2022 1225 by Juluis Mire, RN Outcome: Adequate for Discharge 08/25/2022 1225 by Juluis Mire, RN Outcome: Adequate for Discharge   Problem: Safety: Goal: Ability to remain free from injury will improve 08/25/2022 1225 by Juluis Mire, RN Outcome: Adequate for Discharge 08/25/2022 1225 by  Juluis Mire, RN Outcome: Adequate for Discharge   Problem: Skin Integrity: Goal: Risk for impaired skin integrity will decrease 08/25/2022 1225 by Juluis Mire, RN Outcome: Adequate for Discharge 08/25/2022 1225 by Juluis Mire, RN Outcome: Adequate for Discharge

## 2022-08-25 NOTE — Discharge Summary (Signed)
PATIENT DETAILS Name: Clifford Maldonado Age: 67 y.o. Sex: male Date of Birth: 03-05-1955 MRN: [id number]. Admitting Physician: Charlsie Quest, MD PCP:Pcp, No  Admit Date: 08/23/2022 Discharge date: 08/25/2022  Recommendations for Outpatient Follow-up:  Follow up with PCP in 1-2 weeks Please obtain CMP/CBC in one week Please follow up on the following pending results:  Admitted From:  Home  Disposition: Home   Discharge Condition: good  CODE STATUS:   Code Status: Full Code   Diet recommendation:  Diet Order             Diet general           Diet regular Room service appropriate? Yes; Fluid consistency: Thin  Diet effective now                    Brief Summary: Clifford Maldonado is a 68 y.o. male with medical history significant for COPD with chronic bronchitis who is admitted with acute hypoxic respiratory failure due to multifocal pneumonia and COPD exacerbation.  Brief Hospital Course: Acute hypoxic respiratory failure secondary to multifocal pneumonia and COPD exacerbation Rapidly improved with steroids/antibiotics and bronchodilators Titrated to room air this morning and remained stable Patient also reports clinical improvement-ambulated by nursing staff-and not acutely short of breath-and doing well Patient apparently works at [employer] job-and he is requesting that he be discharged to home so that he can continue to work so that he can keep his appointment. Will transition to oral antibiotic, tapering steroids-and place him on a bronchodilator regimen (not on any inhalers prior to this hospitalization) Note-leukocytosis is likely from steroids-he is clinically improved and is being discharged home at his own request.  History of tobacco use  patient counseled to continue to abstain from further  tobacco use.  BMI: Estimated body mass index is 21.46 kg/m as calculated from the following:   Height as of 03/17/22: 5\' 4"  (1.626 m).   Weight as of this  encounter: 56.7 kg.   Discharge Diagnoses:  Principal Problem:   Acute respiratory failure with hypoxia (HCC) Active Problems:   Multifocal pneumonia   COPD with acute exacerbation [facility])   Discharge Instructions:  Activity:  As tolerated  Discharge Instructions     Ambulatory referral to Pulmonology   Complete by: As directed    Reason for referral: Asthma/COPD   Call MD for:  difficulty breathing, headache or visual disturbances   Complete by: As directed    Diet general   Complete by: As directed    Discharge instructions   Complete by: As directed    Follow with Primary MD 1-2 weeks  Please get a complete blood count and chemistry panel checked by your Primary MD at your next visit, and again as instructed by your Primary MD.  Get Medicines reviewed and adjusted: Please take all your medications with you for your next visit with your Primary MD  Laboratory/radiological data: Please request your Primary MD to go over all hospital tests and procedure/radiological results at the follow up, please ask your Primary MD to get all Hospital records sent to his/her office.  In some cases, they will be blood work, cultures and biopsy results pending at the time of your discharge. Please request that your primary care M.D. follows up on these results.  Also Note the following: If you experience worsening of your admission symptoms, develop shortness of breath, life threatening emergency, suicidal or homicidal thoughts you must seek medical attention immediately by calling 911  or calling your MD immediately  if symptoms less severe.  You must read complete instructions/literature along with all the possible adverse reactions/side effects for all the Medicines you take and that have been prescribed to you. Take any new Medicines after you have completely understood and accpet all the possible adverse reactions/side effects.   Do not drive when taking Pain medications or sleeping  medications (Benzodaizepines)  Do not take more than prescribed Pain, Sleep and Anxiety Medications. It is not advisable to combine anxiety,sleep and pain medications without talking with your primary care practitioner  Special Instructions: If you have smoked or chewed Tobacco  in the last 2 yrs please stop smoking, stop any regular Alcohol  and or any Recreational drug use.  Wear Seat belts while driving.  Please note: You were cared for by a hospitalist during your hospital stay. Once you are discharged, your primary care physician will handle any further medical issues. Please note that NO REFILLS for any discharge medications will be authorized once you are discharged, as it is imperative that you return to your primary care physician (or establish a relationship with a primary care physician if you do not have one) for your post hospital discharge needs so that they can reassess your need for medications and monitor your lab values.   Increase activity slowly   Complete by: As directed       Allergies as of 08/25/2022   No Known Allergies      Medication List     TAKE these medications    albuterol 108 (90 Base) MCG/ACT inhaler Commonly known as: VENTOLIN HFA Inhale 2 puffs into the lungs every 6 (six) hours as needed for wheezing or shortness of breath.   amoxicillin-clavulanate 875-125 MG tablet Commonly known as: AUGMENTIN Take 1 tablet by mouth 2 (two) times daily for 4 days.   azithromycin 500 MG tablet Commonly known as: Zithromax Take 1 tablet (500 mg total) by mouth daily for 1 day.   mometasone-formoterol 100-5 MCG/ACT Aero Commonly known as: DULERA Inhale 2 puffs into the lungs 2 (two) times daily.   predniSONE 10 MG tablet Commonly known as: DELTASONE Take 40 mg daily for 1 day, 30 mg daily for 1 day, 20 mg daily for 1 days,10 mg daily for 1 day, then stop   umeclidinium bromide 62.5 MCG/ACT Aepb Commonly known as: INCRUSE ELLIPTA Inhale 1 puff into the  lungs daily.        Follow-up Information     [facility] Follow up.   Specialty: Pulmonology Why: Hospital follow up, Office will call with date/time, If you dont hear from them,please give them a call Contact information: [address] [place] [address] [phone]        Primary care MD. Schedule an appointment as soon as possible for a visit in 1 week(s).                 No Known Allergies   Other Procedures/Studies: DG Chest Portable 1 View  Result Date: 08/23/2022 CLINICAL DATA:  Shortness of breath, productive cough EXAM: PORTABLE CHEST 1 VIEW COMPARISON:  03/17/2022 FINDINGS: Cardiac size is within normal limits. Low position of diaphragms may suggest COPD. There is interval appearance of fairly extensive patchy infiltrates in both lower lung fields suggesting multifocal pneumonia, more so in the left lower lung field. There is minimal blunting of left lateral CP angle. There is no pneumothorax. IMPRESSION: There are  fairly extensive new patchy infiltrates in both lower lung fields, more so on the left side suggesting multifocal pneumonia. Possible small left pleural effusion. Electronically Signed   By: Ernie Avena M.D.   On: 08/23/2022 20:08     TODAY-DAY OF DISCHARGE:  Subjective:   Althea Grimmer today has no headache,no chest abdominal pain,no new weakness tingling or numbness, feels much better wants to go home today.   Objective:   Blood pressure 100/72, pulse 68, temperature 98 F (36.7 C), temperature source Oral, resp. rate 18, weight 56.7 kg, SpO2 93%.  Intake/Output Summary (Last 24 hours) at 08/25/2022 0952 Last data filed at 08/25/2022 0900 Gross per 24 hour  Intake 590 ml  Output 750 ml  Net -160 ml   Filed Weights   08/23/22 1922 08/24/22 1300  Weight: 56.7 kg 56.7 kg    Exam: Awake Alert, Oriented *3, No new F.N deficits, Normal affect [place].AT,PERRAL Supple  Neck,No JVD, No cervical lymphadenopathy appriciated.  Symmetrical Chest wall movement, Good air movement bilaterally, CTAB RRR,No Gallops,Rubs or new Murmurs, No Parasternal Heave +ve B.Sounds, Abd Soft, Non tender, No organomegaly appriciated, No rebound -guarding or rigidity. No Cyanosis, Clubbing or edema, No new Rash or bruise   PERTINENT RADIOLOGIC STUDIES: DG Chest Portable 1 View  Result Date: 08/23/2022 CLINICAL DATA:  Shortness of breath, productive cough EXAM: PORTABLE CHEST 1 VIEW COMPARISON:  03/17/2022 FINDINGS: Cardiac size is within normal limits. Low position of diaphragms may suggest COPD. There is interval appearance of fairly extensive patchy infiltrates in both lower lung fields suggesting multifocal pneumonia, more so in the left lower lung field. There is minimal blunting of left lateral CP angle. There is no pneumothorax. IMPRESSION: There are fairly extensive new patchy infiltrates in both lower lung fields, more so on the left side suggesting multifocal pneumonia. Possible small left pleural effusion. Electronically Signed   By: Ernie Avena M.D.   On: 08/23/2022 20:08     PERTINENT LAB RESULTS: CBC: Recent Labs    08/24/22 0246 08/25/22 0434  WBC 9.8 20.6*  HGB 13.4 12.2*  HCT 39.2 34.6*  PLT 457* 484*   CMET CMP     Component Value Date/Time   NA 137 08/25/2022 0434   K 3.9 08/25/2022 0434   CL 95 (L) 08/25/2022 0434   CO2 32 08/25/2022 0434   GLUCOSE 183 (H) 08/25/2022 0434   BUN 24 (H) 08/25/2022 0434   CREATININE 0.82 08/25/2022 0434   CALCIUM 9.3 08/25/2022 0434   PROT 7.1 03/17/2022 0349   ALBUMIN 2.0 (L) 08/25/2022 0434   AST 114 (H) 03/17/2022 0349   ALT 99 (H) 03/17/2022 0349   ALKPHOS 71 03/17/2022 0349   BILITOT 0.4 03/17/2022 0349   GFRNONAA >60 08/25/2022 0434    GFR Estimated Creatinine Clearance: 69.1 mL/min (by C-G formula based on SCr of 0.82 mg/dL). No results for input(s): "LIPASE", "AMYLASE" in the last 72 hours. No  results for input(s): "CKTOTAL", "CKMB", "CKMBINDEX", "TROPONINI" in the last 72 hours. Invalid input(s): "POCBNP" No results for input(s): "DDIMER" in the last 72 hours. No results for input(s): "HGBA1C" in the last 72 hours. No results for input(s): "CHOL", "HDL", "LDLCALC", "TRIG", "CHOLHDL", "LDLDIRECT" in the last 72 hours. No results for input(s): "TSH", "T4TOTAL", "T3FREE", "THYROIDAB" in the last 72 hours.  Invalid input(s): "FREET3" No results for input(s): "VITAMINB12", "FOLATE", "FERRITIN", "TIBC", "IRON", "RETICCTPCT" in the last 72 hours. Coags: No results for input(s): "INR" in the last 72 hours.  Invalid input(s): "PT"  Microbiology: Recent Results (from the past 240 hour(s))  SARS Coronavirus 2 by RT PCR (hospital order, performed in [facility] hospital lab) *cepheid single result test* Anterior Nasal Swab     Status: None   Collection Time: 08/23/22 10:00 PM   Specimen: Anterior Nasal Swab  Result Value Ref Range Status   SARS Coronavirus 2 by RT PCR NEGATIVE NEGATIVE Final    Comment: Performed at [facility], [address] [place], [place] [address]  Expectorated Sputum Assessment w Gram Stain, Rflx to Resp Cult     Status: None   Collection Time: 08/24/22 11:20 AM   Specimen: Expectorated Sputum  Result Value Ref Range Status   Specimen Description EXPECTORATED SPUTUM  Final   Special Requests NONE  Final   Sputum evaluation   Final    THIS SPECIMEN IS ACCEPTABLE FOR SPUTUM CULTURE Performed at [facility], [address], [place], [place] [address]    Report Status 08/25/2022 FINAL  Final  Culture, Respiratory w Gram Stain     Status: None (Preliminary result)   Collection Time: 08/24/22 11:20 AM  Result Value Ref Range Status   Specimen Description EXPECTORATED SPUTUM  Final   Special Requests NONE Reflexed from [id number]  Final   Gram Stain   Final    FEW WBC PRESENT,BOTH PMN AND MONONUCLEAR FEW GRAM POSITIVE COCCI IN PAIRS IN  SINGLES Performed at [facility], [address] [place], [place] [address]    Culture PENDING  Incomplete   Report Status PENDING  Incomplete    FURTHER DISCHARGE INSTRUCTIONS:  Get Medicines reviewed and adjusted: Please take all your medications with you for your next visit with your Primary MD  Laboratory/radiological data: Please request your Primary MD to go over all hospital tests and procedure/radiological results at the follow up, please ask your Primary MD to get all Hospital records sent to his/her office.  In some cases, they will be blood work, cultures and biopsy results pending at the time of your discharge. Please request that your primary care M.D. goes through all the records of your hospital data and follows up on these results.  Also Note the following: If you experience worsening of your admission symptoms, develop shortness of breath, life threatening emergency, suicidal or homicidal thoughts you must seek medical attention immediately by calling 911 or calling your MD immediately  if symptoms less severe.  You must read complete instructions/literature along with all the possible adverse reactions/side effects for all the Medicines you take and that have been prescribed to you. Take any new Medicines after you have completely understood and accpet all the possible adverse reactions/side effects.   Do not drive when taking Pain medications or sleeping medications (Benzodaizepines)  Do not take more than prescribed Pain, Sleep and Anxiety Medications. It is not advisable to combine anxiety,sleep and pain medications without talking with your primary care practitioner  Special Instructions: If you have smoked or chewed Tobacco  in the last 2 yrs please stop smoking, stop any regular Alcohol  and or any Recreational drug use.  Wear Seat belts while driving.  Please note: You were cared for by a hospitalist during your hospital stay. Once you are discharged,  your primary care physician will handle any further medical issues. Please note that NO REFILLS for any discharge medications will be authorized once you are discharged, as it is imperative that you return to your primary care physician (or establish a relationship with  a primary care physician if you do not have one) for your post hospital discharge needs so that they can reassess your need for medications and monitor your lab values.  Total Time spent coordinating discharge including counseling, education and face to face time equals greater than 30 minutes.  SignedJeoffrey Massed 08/25/2022 9:52 AM

## 2022-08-25 NOTE — Progress Notes (Signed)
Waiting on TOC meds and AVS completion.   Girlfriend said she cannot pick patient up for a few hours.

## 2022-08-25 NOTE — TOC Transition Note (Signed)
Transition of Care [facility]) - CM/SW Discharge Note   Patient Details  Name: Clifford Maldonado MRN: [id number] Date of Birth: Feb 05, 1955  [facility] CM/SW Contact:  Mearl Latin, LCSW Phone Number: 08/25/2022, 11:59 AM   Clinical Narrative:    CSW spoke with patient and his significant other at bedside. 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 He reported he did not want to stay in his car in this temperature. CSW provided community resources, explained shelter process, and IRC. Patient stated he does not want to go to the [facility]. Patient reported he receives Social security income. Patient denied substance use so stated he does not qualify for [facility]. Patient answered a phone call and CSW continued conversation with Treva. She stated she just received notification that her own housing has been approved for Friday but that she is going to spend her last funds to get him a hotel room. She stated she was advised to put him on the lease as well so she does not get in trouble. She requested a PCP appointment at [facility]. CSW made appointment (patient provided consent to [facility] over the phone) and placed info on AVS. [employer] stated she is going to run an errand and then come pick patient up.    Final next level of care: Home/Self Care Barriers to Discharge: Barriers Resolved   Patient Goals and CMS Choice CMS Medicare.gov Compare Post Acute Care list provided to:: Patient Choice offered to / list presented to : Patient  Discharge Placement                  Patient to be transferred to facility by: Sig other   Patient and family notified of of transfer: 08/25/22  Discharge Plan and Services Additional resources added to the After Visit Summary for   In-house Referral: Clinical Social Work   Post Acute Care Choice: NA                               Social Determinants of Health  (SDOH) Interventions SDOH Screenings   Food Insecurity: No Food Insecurity (08/24/2022)  Housing: Low Risk  (08/24/2022)  Transportation Needs: No Transportation Needs (08/24/2022)  Utilities: Not At Risk (08/24/2022)  Tobacco Use: Low Risk  (08/23/2022)     Readmission Risk Interventions     No data to display

## 2023-05-04 DIAGNOSIS — Z0001 Encounter for general adult medical examination with abnormal findings: Secondary | ICD-10-CM | POA: Diagnosis not present

## 2023-05-04 DIAGNOSIS — J449 Chronic obstructive pulmonary disease, unspecified: Secondary | ICD-10-CM | POA: Diagnosis not present

## 2023-05-04 DIAGNOSIS — F17211 Nicotine dependence, cigarettes, in remission: Secondary | ICD-10-CM | POA: Diagnosis not present

## 2023-05-04 DIAGNOSIS — F320 Major depressive disorder, single episode, mild: Secondary | ICD-10-CM | POA: Diagnosis not present

## 2023-05-04 DIAGNOSIS — Z1159 Encounter for screening for other viral diseases: Secondary | ICD-10-CM | POA: Diagnosis not present

## 2023-05-04 DIAGNOSIS — Z0189 Encounter for other specified special examinations: Secondary | ICD-10-CM | POA: Diagnosis not present

## 2023-05-04 DIAGNOSIS — J42 Unspecified chronic bronchitis: Secondary | ICD-10-CM | POA: Diagnosis not present

## 2023-05-04 DIAGNOSIS — Z79899 Other long term (current) drug therapy: Secondary | ICD-10-CM | POA: Diagnosis not present

## 2023-05-04 DIAGNOSIS — J209 Acute bronchitis, unspecified: Secondary | ICD-10-CM | POA: Diagnosis not present

## 2023-05-04 DIAGNOSIS — Z136 Encounter for screening for cardiovascular disorders: Secondary | ICD-10-CM | POA: Diagnosis not present

## 2023-05-04 DIAGNOSIS — Z125 Encounter for screening for malignant neoplasm of prostate: Secondary | ICD-10-CM | POA: Diagnosis not present

## 2023-05-04 DIAGNOSIS — F1010 Alcohol abuse, uncomplicated: Secondary | ICD-10-CM | POA: Diagnosis not present

## 2024-01-11 IMAGING — CR DG KNEE COMPLETE 4+V*L*
4 series · 4 of 4 positions shown · non-contrast
Comparison: None Available.

CLINICAL DATA: Left knee pain after twisting injury 3 days ago

EXAM:
LEFT KNEE - COMPLETE 4+ VIEW

[knee ap]
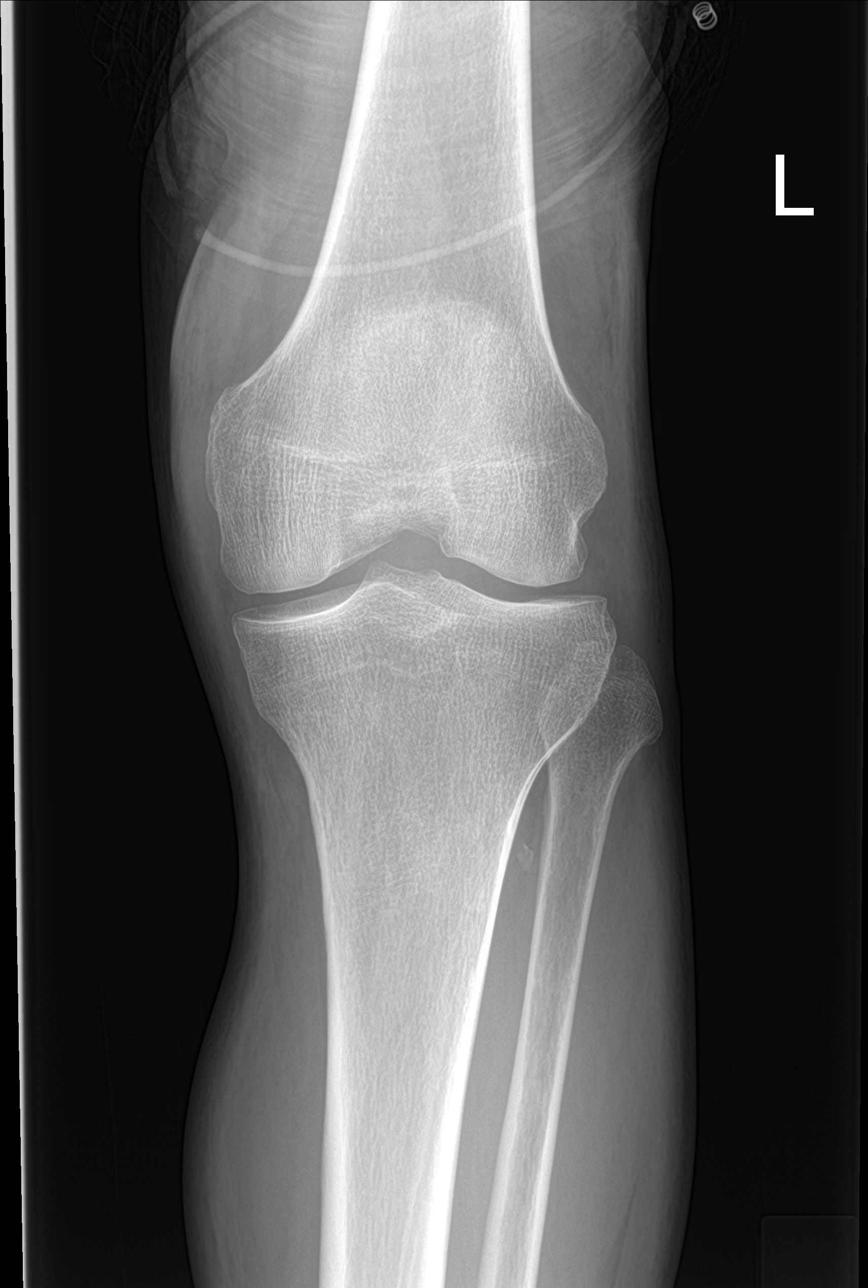

[knee lat]
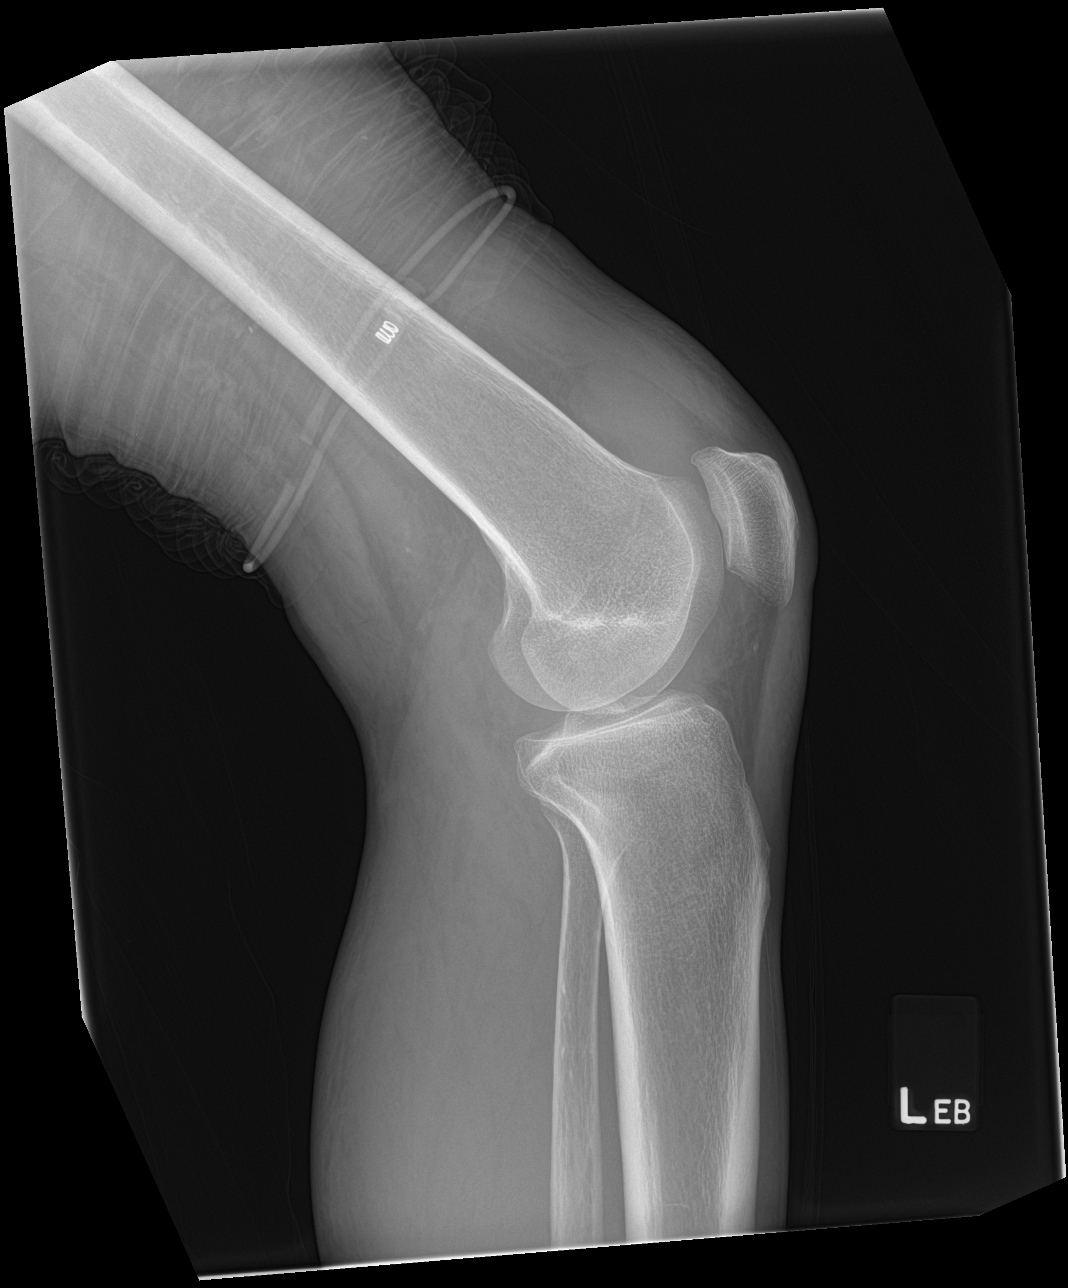

[knee obl (1 of 2)]
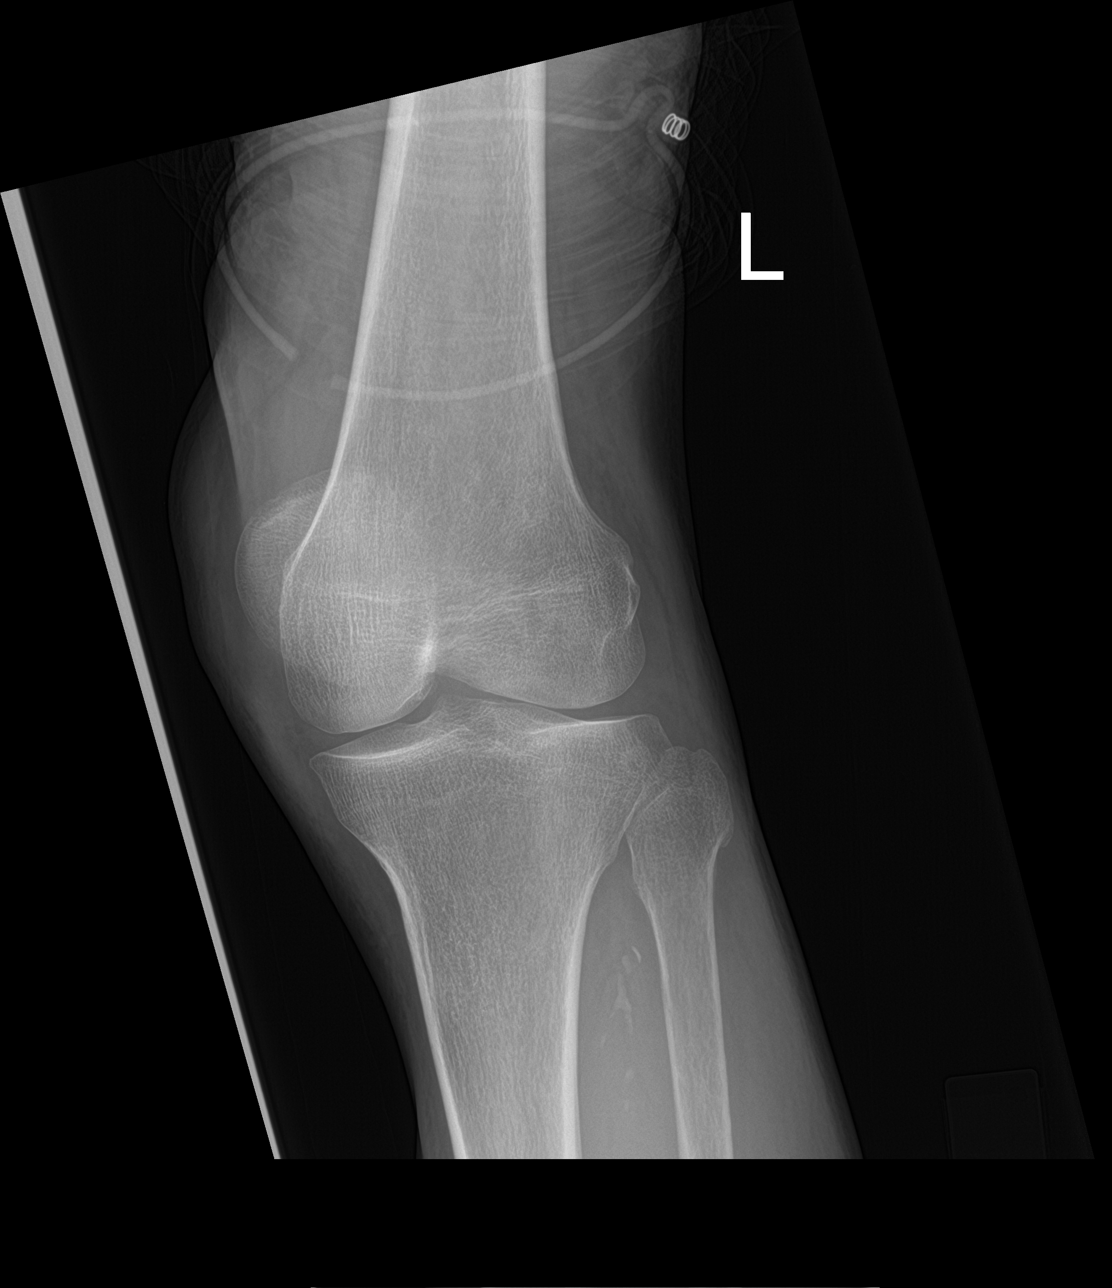

[knee obl (2 of 2)]
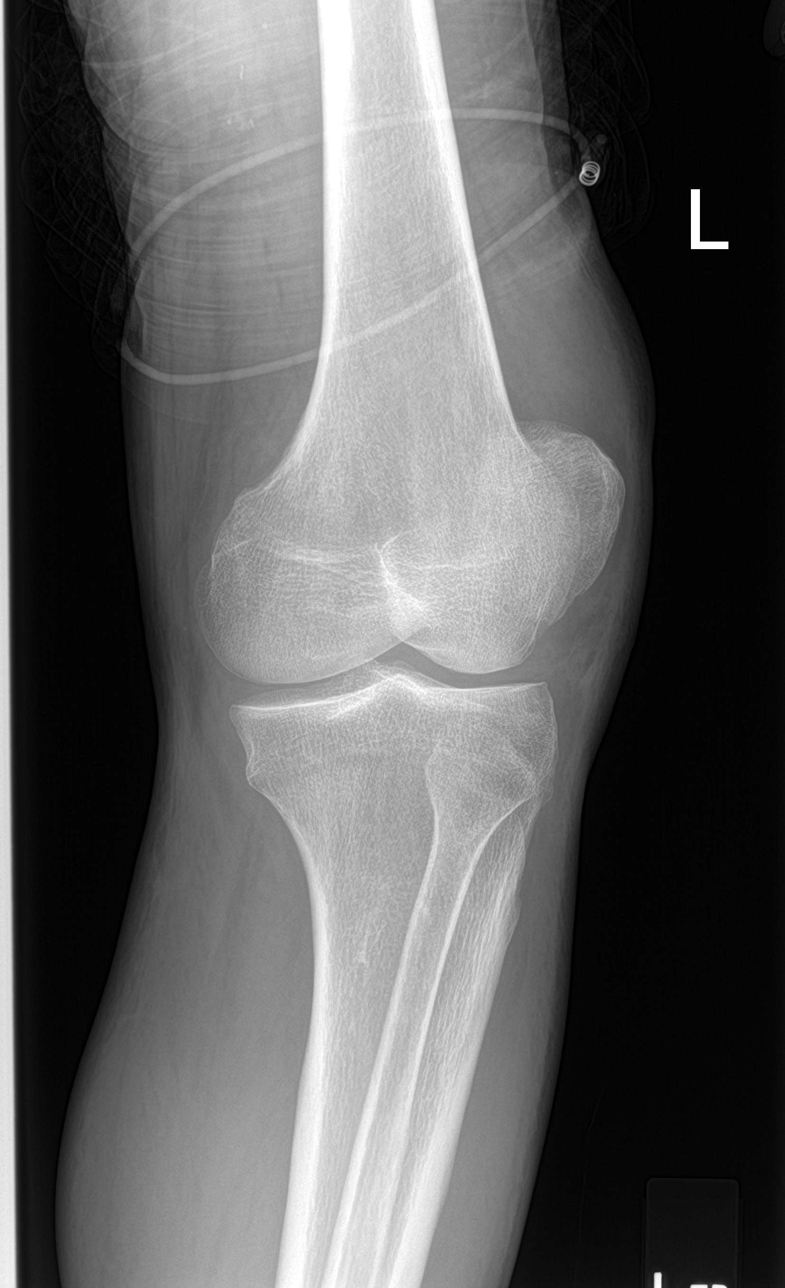

[4 of 4 positions shown; findings below may reference images not displayed]

FINDINGS: No evidence of acute fracture. No malalignment. Moderate-sized knee
joint effusion. No visible fat-fluid level. Joint spaces are
preserved. No evidence of arthropathy or other focal bone
abnormality. Soft tissues are unremarkable.
IMPRESSION: 1. Moderate-sized knee joint effusion.
2. No acute fracture or malalignment.
